# Patient Record
Sex: Female | Born: 1935 | Race: White | Hispanic: No | Smoking: Former smoker
Health system: Southern US, Community
[De-identification: ages and names within clinical notes are randomized; demographics above are authoritative.]

## PROBLEM LIST (undated history)

## (undated) DIAGNOSIS — H547 Unspecified visual loss: Secondary | ICD-10-CM

## (undated) DIAGNOSIS — I10 Essential (primary) hypertension: Secondary | ICD-10-CM

## (undated) DIAGNOSIS — E785 Hyperlipidemia, unspecified: Secondary | ICD-10-CM

## (undated) DIAGNOSIS — C4490 Unspecified malignant neoplasm of skin, unspecified: Secondary | ICD-10-CM

## (undated) HISTORY — DX: Unspecified malignant neoplasm of skin, unspecified: C44.90

## (undated) HISTORY — PX: OTHER SURGICAL HISTORY: SHX169

## (undated) HISTORY — DX: Unspecified visual loss: H54.7

## (undated) HISTORY — PX: CATARACT EXTRACTION: SUR2

## (undated) HISTORY — DX: Essential (primary) hypertension: I10

## (undated) HISTORY — DX: Hyperlipidemia, unspecified: E78.5

---

## 2005-03-26 ENCOUNTER — Ambulatory Visit (HOSPITAL_COMMUNITY): Admission: RE | Admit: 2005-03-26 | Discharge: 2005-03-26 | Payer: Self-pay | Admitting: Ophthalmology

## 2011-06-11 ENCOUNTER — Encounter (INDEPENDENT_AMBULATORY_CARE_PROVIDER_SITE_OTHER): Payer: Medicare Other | Admitting: Ophthalmology

## 2011-06-11 DIAGNOSIS — H353290 Exudative age-related macular degeneration, unspecified eye, stage unspecified: Secondary | ICD-10-CM

## 2011-06-11 DIAGNOSIS — H3530 Unspecified macular degeneration: Secondary | ICD-10-CM

## 2011-06-11 DIAGNOSIS — H43819 Vitreous degeneration, unspecified eye: Secondary | ICD-10-CM

## 2011-07-23 ENCOUNTER — Encounter (INDEPENDENT_AMBULATORY_CARE_PROVIDER_SITE_OTHER): Payer: Medicare Other | Admitting: Ophthalmology

## 2011-07-23 DIAGNOSIS — H353290 Exudative age-related macular degeneration, unspecified eye, stage unspecified: Secondary | ICD-10-CM

## 2011-07-23 DIAGNOSIS — H43819 Vitreous degeneration, unspecified eye: Secondary | ICD-10-CM

## 2011-07-23 DIAGNOSIS — H3530 Unspecified macular degeneration: Secondary | ICD-10-CM

## 2011-07-23 DIAGNOSIS — H2510 Age-related nuclear cataract, unspecified eye: Secondary | ICD-10-CM

## 2011-09-02 ENCOUNTER — Encounter (INDEPENDENT_AMBULATORY_CARE_PROVIDER_SITE_OTHER): Payer: Medicare Other | Admitting: Ophthalmology

## 2011-09-02 DIAGNOSIS — H353290 Exudative age-related macular degeneration, unspecified eye, stage unspecified: Secondary | ICD-10-CM

## 2011-09-02 DIAGNOSIS — H3530 Unspecified macular degeneration: Secondary | ICD-10-CM

## 2011-09-02 DIAGNOSIS — H43819 Vitreous degeneration, unspecified eye: Secondary | ICD-10-CM

## 2011-09-02 DIAGNOSIS — H2510 Age-related nuclear cataract, unspecified eye: Secondary | ICD-10-CM

## 2011-09-22 ENCOUNTER — Encounter: Payer: Self-pay | Admitting: Internal Medicine

## 2011-09-22 ENCOUNTER — Ambulatory Visit (INDEPENDENT_AMBULATORY_CARE_PROVIDER_SITE_OTHER): Payer: Medicare Other | Admitting: Internal Medicine

## 2011-09-22 DIAGNOSIS — F17200 Nicotine dependence, unspecified, uncomplicated: Secondary | ICD-10-CM | POA: Insufficient documentation

## 2011-09-22 DIAGNOSIS — J449 Chronic obstructive pulmonary disease, unspecified: Secondary | ICD-10-CM | POA: Insufficient documentation

## 2011-09-22 MED ORDER — PREDNISONE (PAK) 10 MG PO TABS: ORAL_TABLET | ORAL | Status: AC

## 2011-09-22 NOTE — Assessment & Plan Note (Signed)
Moderate/ severe with CB features and likely mscp from coughing. Has already failed duoneb and advair and "not interested in taking any medicaitons"  - the best we can probably do here is a short burst of prednisone then regroup with ct chest/ sinus and pft's if she wants our help.  However.  I emphasized that although we never turn away smokers from the pulmonary clinic, we do ask that they understand that the recommendations that we make  won't work nearly as well in the presence of continued cigarette exposure.  In fact, we may very well  reach a point where we can't promise to help the patient if he/she can't quit smoking. (We can and will promise to try to help, we just can't promise what we recommend will really work)

## 2011-09-22 NOTE — Patient Instructions (Addendum)
Stop smoking before it stops you - this is the most important aspect of your care- none of my recommendations will really be effective if you keep smoking.  For cough take mucinex dm over the counter  For short of breath or congestion, use neb up to every 4  hours  Prednisone 10 mg take  4 each am x 2 days,   2 each am x 2 days,  1 each am x2days and stop   You will need a cxr today at [facility] office and I will call you with the results  If not better need CT Chest or sinus, call Almyra Free [phone] to set this up

## 2011-09-22 NOTE — Progress Notes (Signed)
  Subjective:    Patient ID: Kristen Hogan, female    DOB: 08-Jun-1922, 77 y.o.   MRN: [id number]  HPI  47 yowf active smoker with tendency to cough in winter but only mild and not requiring ov's referred by Dr Kathi Der office for new onset persistent cough since July 2012  09/22/2011  Initial pulmonary office eval in EMR era cc indolent onset persistent congested day > night cough x July 2012 prod thick white mucus assoc with L  Ant/lat chest discomfort rx with neb > no better and made her shaky, no better with advair  - pattern was indolent onset persistent, not progressive, tends to resolve supine, no worse with activity and she denies limiting sob though relatively sedentary.  Sleeping ok without nocturnal  or early am exacerbation  of respiratory  c/o's or need for noct saba. Also denies any obvious fluctuation of symptoms with weather or environmental changes or other aggravating or alleviating factors except as outlined above and able to walk x 30 min s stopping.   Review of Systems  Constitutional: Negative for fever and unexpected weight change.  HENT: Negative for ear pain, nosebleeds, congestion, sore throat, rhinorrhea, sneezing, trouble swallowing, dental problem, postnasal drip and sinus pressure.   Eyes: Negative for redness and itching.  Respiratory: Positive for cough and chest tightness. Negative for shortness of breath and wheezing.   Cardiovascular: Negative for palpitations and leg swelling.  Gastrointestinal: Negative for nausea and vomiting.  Genitourinary: Negative for dysuria.  Musculoskeletal: Negative for joint swelling.  Skin: Negative for rash.  Neurological: Negative for headaches.  Hematological: Does not bruise/bleed easily.  Psychiatric/Behavioral: Negative for dysphoric mood. The patient is not nervous/anxious.        Objective:   Physical Exam 09/22/2011  111  amb wf with weathered facies and congested cough  Wt 111 09/22/2011   HEENT mild turbinate  edema.  Oropharynx no thrush or excess pnd or cobblestoning.  No JVD or cervical adenopathy. Mild accessory muscle hypertrophy. Trachea midline, nl thryroid. Chest was hyperinflated by percussion with diminished breath sounds and moderate increased exp time without wheeze. Hoover sign positive at mid inspiration. Regular rate and rhythm without murmur gallop or rub or increase P2 or edema.  Abd: no hsm, nl excursion. Ext warm without cyanosis or clubbing.     CXR  09/22/2011 :  COPD/ emphysema only, no effusion or obvious rib deformity    Assessment & Plan:

## 2011-09-22 NOTE — Assessment & Plan Note (Signed)
>   3 min discussion re:  I took an extended  opportunity with this patient to outline the consequences of continued cigarette use  in airway disorders based on all the data we have from the multiple national lung health studies (perfomed over decades at millions of dollars in cost)  indicating that smoking cessation, not choice of inhalers or physicians, is the most important aspect of care.

## 2011-10-14 ENCOUNTER — Encounter (INDEPENDENT_AMBULATORY_CARE_PROVIDER_SITE_OTHER): Payer: Medicare Other | Admitting: Ophthalmology

## 2011-10-14 DIAGNOSIS — H3530 Unspecified macular degeneration: Secondary | ICD-10-CM

## 2011-10-14 DIAGNOSIS — H353290 Exudative age-related macular degeneration, unspecified eye, stage unspecified: Secondary | ICD-10-CM

## 2011-10-14 DIAGNOSIS — H43819 Vitreous degeneration, unspecified eye: Secondary | ICD-10-CM

## 2011-10-14 DIAGNOSIS — H2510 Age-related nuclear cataract, unspecified eye: Secondary | ICD-10-CM

## 2011-11-25 ENCOUNTER — Encounter (INDEPENDENT_AMBULATORY_CARE_PROVIDER_SITE_OTHER): Payer: Medicare Other | Admitting: Ophthalmology

## 2012-02-04 ENCOUNTER — Encounter (INDEPENDENT_AMBULATORY_CARE_PROVIDER_SITE_OTHER): Payer: Medicare Other | Admitting: Ophthalmology

## 2012-02-04 DIAGNOSIS — H2510 Age-related nuclear cataract, unspecified eye: Secondary | ICD-10-CM

## 2012-02-04 DIAGNOSIS — H43819 Vitreous degeneration, unspecified eye: Secondary | ICD-10-CM

## 2012-02-04 DIAGNOSIS — H353290 Exudative age-related macular degeneration, unspecified eye, stage unspecified: Secondary | ICD-10-CM

## 2012-02-04 DIAGNOSIS — H3530 Unspecified macular degeneration: Secondary | ICD-10-CM

## 2012-03-03 ENCOUNTER — Encounter (INDEPENDENT_AMBULATORY_CARE_PROVIDER_SITE_OTHER): Payer: Medicare Other | Admitting: Ophthalmology

## 2012-03-03 DIAGNOSIS — H43819 Vitreous degeneration, unspecified eye: Secondary | ICD-10-CM

## 2012-03-03 DIAGNOSIS — H353290 Exudative age-related macular degeneration, unspecified eye, stage unspecified: Secondary | ICD-10-CM

## 2012-03-03 DIAGNOSIS — H3530 Unspecified macular degeneration: Secondary | ICD-10-CM

## 2012-03-03 DIAGNOSIS — H2510 Age-related nuclear cataract, unspecified eye: Secondary | ICD-10-CM

## 2012-03-30 ENCOUNTER — Encounter (INDEPENDENT_AMBULATORY_CARE_PROVIDER_SITE_OTHER): Payer: Medicare Other | Admitting: Ophthalmology

## 2012-03-30 DIAGNOSIS — H353290 Exudative age-related macular degeneration, unspecified eye, stage unspecified: Secondary | ICD-10-CM

## 2012-03-30 DIAGNOSIS — H2510 Age-related nuclear cataract, unspecified eye: Secondary | ICD-10-CM

## 2012-03-30 DIAGNOSIS — H43819 Vitreous degeneration, unspecified eye: Secondary | ICD-10-CM

## 2012-03-30 DIAGNOSIS — H3530 Unspecified macular degeneration: Secondary | ICD-10-CM

## 2012-04-27 ENCOUNTER — Encounter (INDEPENDENT_AMBULATORY_CARE_PROVIDER_SITE_OTHER): Payer: Medicare Other | Admitting: Ophthalmology

## 2012-04-27 DIAGNOSIS — H3530 Unspecified macular degeneration: Secondary | ICD-10-CM

## 2012-04-27 DIAGNOSIS — H2510 Age-related nuclear cataract, unspecified eye: Secondary | ICD-10-CM

## 2012-04-27 DIAGNOSIS — H43819 Vitreous degeneration, unspecified eye: Secondary | ICD-10-CM

## 2012-04-27 DIAGNOSIS — H353290 Exudative age-related macular degeneration, unspecified eye, stage unspecified: Secondary | ICD-10-CM

## 2012-05-26 ENCOUNTER — Encounter (INDEPENDENT_AMBULATORY_CARE_PROVIDER_SITE_OTHER): Payer: Medicare Other | Admitting: Ophthalmology

## 2012-05-26 DIAGNOSIS — H353290 Exudative age-related macular degeneration, unspecified eye, stage unspecified: Secondary | ICD-10-CM

## 2012-05-26 DIAGNOSIS — H35039 Hypertensive retinopathy, unspecified eye: Secondary | ICD-10-CM

## 2012-05-26 DIAGNOSIS — H3530 Unspecified macular degeneration: Secondary | ICD-10-CM

## 2012-05-26 DIAGNOSIS — I10 Essential (primary) hypertension: Secondary | ICD-10-CM

## 2012-05-26 DIAGNOSIS — H2510 Age-related nuclear cataract, unspecified eye: Secondary | ICD-10-CM

## 2012-05-26 DIAGNOSIS — H43399 Other vitreous opacities, unspecified eye: Secondary | ICD-10-CM

## 2012-06-22 ENCOUNTER — Encounter (INDEPENDENT_AMBULATORY_CARE_PROVIDER_SITE_OTHER): Payer: Medicare Other | Admitting: Ophthalmology

## 2012-06-22 DIAGNOSIS — I10 Essential (primary) hypertension: Secondary | ICD-10-CM

## 2012-06-22 DIAGNOSIS — H353290 Exudative age-related macular degeneration, unspecified eye, stage unspecified: Secondary | ICD-10-CM

## 2012-06-22 DIAGNOSIS — H43819 Vitreous degeneration, unspecified eye: Secondary | ICD-10-CM

## 2012-06-22 DIAGNOSIS — H2510 Age-related nuclear cataract, unspecified eye: Secondary | ICD-10-CM

## 2012-06-22 DIAGNOSIS — H35039 Hypertensive retinopathy, unspecified eye: Secondary | ICD-10-CM

## 2012-06-22 DIAGNOSIS — H3530 Unspecified macular degeneration: Secondary | ICD-10-CM

## 2012-07-21 ENCOUNTER — Encounter (INDEPENDENT_AMBULATORY_CARE_PROVIDER_SITE_OTHER): Payer: Medicare Other | Admitting: Ophthalmology

## 2012-07-21 DIAGNOSIS — H3530 Unspecified macular degeneration: Secondary | ICD-10-CM

## 2012-07-21 DIAGNOSIS — H353290 Exudative age-related macular degeneration, unspecified eye, stage unspecified: Secondary | ICD-10-CM

## 2012-07-21 DIAGNOSIS — H43819 Vitreous degeneration, unspecified eye: Secondary | ICD-10-CM

## 2012-07-21 DIAGNOSIS — H2510 Age-related nuclear cataract, unspecified eye: Secondary | ICD-10-CM

## 2012-07-21 DIAGNOSIS — H35039 Hypertensive retinopathy, unspecified eye: Secondary | ICD-10-CM

## 2012-07-21 DIAGNOSIS — I10 Essential (primary) hypertension: Secondary | ICD-10-CM

## 2012-08-17 ENCOUNTER — Encounter (INDEPENDENT_AMBULATORY_CARE_PROVIDER_SITE_OTHER): Payer: Medicare Other | Admitting: Ophthalmology

## 2012-08-17 DIAGNOSIS — H2510 Age-related nuclear cataract, unspecified eye: Secondary | ICD-10-CM

## 2012-08-17 DIAGNOSIS — H35039 Hypertensive retinopathy, unspecified eye: Secondary | ICD-10-CM

## 2012-08-17 DIAGNOSIS — H353290 Exudative age-related macular degeneration, unspecified eye, stage unspecified: Secondary | ICD-10-CM

## 2012-08-17 DIAGNOSIS — I10 Essential (primary) hypertension: Secondary | ICD-10-CM

## 2012-08-17 DIAGNOSIS — H3530 Unspecified macular degeneration: Secondary | ICD-10-CM

## 2012-08-17 DIAGNOSIS — H43819 Vitreous degeneration, unspecified eye: Secondary | ICD-10-CM

## 2012-09-15 ENCOUNTER — Encounter (INDEPENDENT_AMBULATORY_CARE_PROVIDER_SITE_OTHER): Payer: Medicare Other | Admitting: Ophthalmology

## 2012-09-15 DIAGNOSIS — H2510 Age-related nuclear cataract, unspecified eye: Secondary | ICD-10-CM

## 2012-09-15 DIAGNOSIS — H35039 Hypertensive retinopathy, unspecified eye: Secondary | ICD-10-CM

## 2012-09-15 DIAGNOSIS — H353290 Exudative age-related macular degeneration, unspecified eye, stage unspecified: Secondary | ICD-10-CM

## 2012-09-15 DIAGNOSIS — H43819 Vitreous degeneration, unspecified eye: Secondary | ICD-10-CM

## 2012-09-15 DIAGNOSIS — H3530 Unspecified macular degeneration: Secondary | ICD-10-CM

## 2012-09-15 DIAGNOSIS — I10 Essential (primary) hypertension: Secondary | ICD-10-CM

## 2012-10-17 ENCOUNTER — Encounter (INDEPENDENT_AMBULATORY_CARE_PROVIDER_SITE_OTHER): Payer: Medicare Other | Admitting: Ophthalmology

## 2012-10-17 DIAGNOSIS — H35039 Hypertensive retinopathy, unspecified eye: Secondary | ICD-10-CM

## 2012-10-17 DIAGNOSIS — H3530 Unspecified macular degeneration: Secondary | ICD-10-CM

## 2012-10-17 DIAGNOSIS — I10 Essential (primary) hypertension: Secondary | ICD-10-CM

## 2012-10-17 DIAGNOSIS — H353290 Exudative age-related macular degeneration, unspecified eye, stage unspecified: Secondary | ICD-10-CM

## 2012-10-17 DIAGNOSIS — H43819 Vitreous degeneration, unspecified eye: Secondary | ICD-10-CM

## 2012-11-23 ENCOUNTER — Encounter (INDEPENDENT_AMBULATORY_CARE_PROVIDER_SITE_OTHER): Payer: Medicare Other | Admitting: Ophthalmology

## 2012-11-23 DIAGNOSIS — H353290 Exudative age-related macular degeneration, unspecified eye, stage unspecified: Secondary | ICD-10-CM

## 2012-11-23 DIAGNOSIS — H43819 Vitreous degeneration, unspecified eye: Secondary | ICD-10-CM

## 2012-11-23 DIAGNOSIS — I10 Essential (primary) hypertension: Secondary | ICD-10-CM

## 2012-11-23 DIAGNOSIS — H3530 Unspecified macular degeneration: Secondary | ICD-10-CM

## 2012-11-23 DIAGNOSIS — H35039 Hypertensive retinopathy, unspecified eye: Secondary | ICD-10-CM

## 2012-12-21 ENCOUNTER — Encounter (INDEPENDENT_AMBULATORY_CARE_PROVIDER_SITE_OTHER): Payer: Medicare Other | Admitting: Ophthalmology

## 2012-12-21 DIAGNOSIS — H35039 Hypertensive retinopathy, unspecified eye: Secondary | ICD-10-CM

## 2012-12-21 DIAGNOSIS — I10 Essential (primary) hypertension: Secondary | ICD-10-CM

## 2012-12-21 DIAGNOSIS — H3530 Unspecified macular degeneration: Secondary | ICD-10-CM

## 2012-12-21 DIAGNOSIS — H353290 Exudative age-related macular degeneration, unspecified eye, stage unspecified: Secondary | ICD-10-CM

## 2013-01-18 ENCOUNTER — Encounter (INDEPENDENT_AMBULATORY_CARE_PROVIDER_SITE_OTHER): Payer: Medicare Other | Admitting: Ophthalmology

## 2013-01-18 DIAGNOSIS — H353290 Exudative age-related macular degeneration, unspecified eye, stage unspecified: Secondary | ICD-10-CM

## 2013-01-18 DIAGNOSIS — H3530 Unspecified macular degeneration: Secondary | ICD-10-CM

## 2013-01-18 DIAGNOSIS — H2510 Age-related nuclear cataract, unspecified eye: Secondary | ICD-10-CM

## 2013-01-18 DIAGNOSIS — H35039 Hypertensive retinopathy, unspecified eye: Secondary | ICD-10-CM

## 2013-01-18 DIAGNOSIS — I10 Essential (primary) hypertension: Secondary | ICD-10-CM

## 2013-01-18 DIAGNOSIS — H43819 Vitreous degeneration, unspecified eye: Secondary | ICD-10-CM

## 2013-02-16 ENCOUNTER — Encounter (INDEPENDENT_AMBULATORY_CARE_PROVIDER_SITE_OTHER): Payer: Medicare Other | Admitting: Ophthalmology

## 2013-02-16 DIAGNOSIS — H3530 Unspecified macular degeneration: Secondary | ICD-10-CM

## 2013-02-16 DIAGNOSIS — H35039 Hypertensive retinopathy, unspecified eye: Secondary | ICD-10-CM

## 2013-02-16 DIAGNOSIS — H353290 Exudative age-related macular degeneration, unspecified eye, stage unspecified: Secondary | ICD-10-CM

## 2013-02-16 DIAGNOSIS — H2510 Age-related nuclear cataract, unspecified eye: Secondary | ICD-10-CM

## 2013-02-16 DIAGNOSIS — H43819 Vitreous degeneration, unspecified eye: Secondary | ICD-10-CM

## 2013-02-16 DIAGNOSIS — I10 Essential (primary) hypertension: Secondary | ICD-10-CM

## 2013-02-18 ENCOUNTER — Other Ambulatory Visit: Payer: Self-pay | Admitting: Family Medicine

## 2013-03-16 ENCOUNTER — Encounter (INDEPENDENT_AMBULATORY_CARE_PROVIDER_SITE_OTHER): Payer: Medicare Other | Admitting: Ophthalmology

## 2013-03-16 DIAGNOSIS — H3530 Unspecified macular degeneration: Secondary | ICD-10-CM

## 2013-03-16 DIAGNOSIS — I10 Essential (primary) hypertension: Secondary | ICD-10-CM

## 2013-03-16 DIAGNOSIS — H2510 Age-related nuclear cataract, unspecified eye: Secondary | ICD-10-CM

## 2013-03-16 DIAGNOSIS — H43819 Vitreous degeneration, unspecified eye: Secondary | ICD-10-CM

## 2013-03-16 DIAGNOSIS — H353290 Exudative age-related macular degeneration, unspecified eye, stage unspecified: Secondary | ICD-10-CM

## 2013-03-16 DIAGNOSIS — H35039 Hypertensive retinopathy, unspecified eye: Secondary | ICD-10-CM

## 2013-03-21 ENCOUNTER — Other Ambulatory Visit: Payer: Self-pay | Admitting: Physician Assistant

## 2013-04-12 ENCOUNTER — Encounter (INDEPENDENT_AMBULATORY_CARE_PROVIDER_SITE_OTHER): Payer: Medicare Other | Admitting: Ophthalmology

## 2013-04-12 DIAGNOSIS — H43819 Vitreous degeneration, unspecified eye: Secondary | ICD-10-CM

## 2013-04-12 DIAGNOSIS — H35039 Hypertensive retinopathy, unspecified eye: Secondary | ICD-10-CM

## 2013-04-12 DIAGNOSIS — H3530 Unspecified macular degeneration: Secondary | ICD-10-CM

## 2013-04-12 DIAGNOSIS — I10 Essential (primary) hypertension: Secondary | ICD-10-CM

## 2013-04-12 DIAGNOSIS — H2510 Age-related nuclear cataract, unspecified eye: Secondary | ICD-10-CM

## 2013-04-12 DIAGNOSIS — H353290 Exudative age-related macular degeneration, unspecified eye, stage unspecified: Secondary | ICD-10-CM

## 2013-04-25 ENCOUNTER — Ambulatory Visit (INDEPENDENT_AMBULATORY_CARE_PROVIDER_SITE_OTHER): Payer: Medicare Other | Admitting: Family Medicine

## 2013-04-25 ENCOUNTER — Encounter: Payer: Self-pay | Admitting: Family Medicine

## 2013-04-25 VITALS — BP 136/64 | HR 74 | Temp 98.0°F | Ht 63.0 in | Wt 115.2 lb

## 2013-04-25 DIAGNOSIS — I10 Essential (primary) hypertension: Secondary | ICD-10-CM

## 2013-04-25 DIAGNOSIS — E785 Hyperlipidemia, unspecified: Secondary | ICD-10-CM

## 2013-04-25 DIAGNOSIS — J441 Chronic obstructive pulmonary disease with (acute) exacerbation: Secondary | ICD-10-CM

## 2013-04-25 DIAGNOSIS — L989 Disorder of the skin and subcutaneous tissue, unspecified: Secondary | ICD-10-CM

## 2013-04-25 LAB — POCT CBC
Granulocyte percent: 79.8 %G (ref 37–80)
HCT, POC: 40.5 % (ref 37.7–47.9)
Hemoglobin: 13.7 g/dL (ref 12.2–16.2)
Lymph, poc: 1.7 (ref 0.6–3.4)
MCH, POC: 30.1 pg (ref 27–31.2)
MCHC: 33.9 g/dL (ref 31.8–35.4)
MCV: 88.8 fL (ref 80–97)
MPV: 8.1 fL (ref 0–99.8)
POC Granulocyte: 7.9 — AB (ref 2–6.9)
POC LYMPH PERCENT: 16.7 %L (ref 10–50)
Platelet Count, POC: 254 10*3/uL (ref 142–424)
RBC: 4.6 M/uL (ref 4.04–5.48)
RDW, POC: 14.4 %
WBC: 9.9 10*3/uL (ref 4.6–10.2)

## 2013-04-25 MED ORDER — POTASSIUM CHLORIDE CRYS ER 20 MEQ PO TBCR: 20.0000 meq | EXTENDED_RELEASE_TABLET | Freq: Every day | ORAL | Status: DC

## 2013-04-25 MED ORDER — AMLODIPINE BESYLATE 5 MG PO TABS: 5.0000 mg | ORAL_TABLET | Freq: Every day | ORAL | Status: DC

## 2013-04-25 MED ORDER — IPRATROPIUM-ALBUTEROL 0.5-2.5 (3) MG/3ML IN SOLN: 3.0000 mL | Freq: Two times a day (BID) | RESPIRATORY_TRACT | Status: DC

## 2013-04-25 MED ORDER — HYDROCHLOROTHIAZIDE 25 MG PO TABS: 25.0000 mg | ORAL_TABLET | Freq: Every day | ORAL | Status: DC

## 2013-04-25 MED ORDER — DILTIAZEM HCL ER COATED BEADS 240 MG PO CP24: 240.0000 mg | ORAL_CAPSULE | Freq: Every day | ORAL | Status: DC

## 2013-04-25 NOTE — Patient Instructions (Signed)

## 2013-04-25 NOTE — Progress Notes (Signed)
  Subjective:    Patient ID: Kristen Hogan, female    DOB: Apr 29, 1922, 79 y.o.   MRN: [id number]  HPI This 79 y.o. female presents for follow up visit for COPD, Hypertension, and hyperlipidemia. She is seeing opthamology in [place] for right eye blindness and shots in her right eye. She has hx of hypoxemia and is on oxygen although she is not wearing it today.  She has some pains in her left intercostals.  She has skin lesions on her nose, chin, left forehead, and right jaw area.    Review of Systems  Constitutional: Negative.   HENT: Negative.   Respiratory: Positive for shortness of breath. Negative for apnea, cough, choking and chest tightness.   Cardiovascular: Negative.   Gastrointestinal: Negative.   Skin: Positive for wound. Negative for color change, pallor and rash.       Objective:   Physical Exam  Constitutional: She appears cachectic. She appears ill.  HENT:  Head: Normocephalic and atraumatic.  Eyes: Conjunctivae are normal. Pupils are equal, round, and reactive to light.  Cardiovascular: Normal rate and regular rhythm.   Pulmonary/Chest: Effort normal and breath sounds normal.  Abdominal: Soft. Normal appearance. Bowel sounds are absent. There is no tenderness.  Skin: Lesion noted.  Scab over tip of nose, cystic lesion over left chin, right jaw area, and left temporal area.           Assessment & Plan:  Skin lesion of face - Plan: Ambulatory referral to Dermatology  Unspecified essential hypertension - Plan: POCT CBC, COMPLETE METABOLIC PANEL WITH GFR, amLODipine (NORVASC) 5 MG tablet, diltiazem (CARDIZEM CD) 240 MG 24 hr capsule, hydrochlorothiazide (HYDRODIURIL) 25 MG tablet, potassium chloride SA (KLOR-CON M20) 20 MEQ tablet  COPD exacerbation - Plan: ipratropium-albuterol (DUONEB) 0.5-2.5 (3) MG/3ML SOLN Check on oxygen orders and reorder oxygen.   Other and unspecified hyperlipidemia - Lipids were normal last blood draw and recommend to  continue Current regimen.  Labs - CMP, CBC, follow up in 3 months.

## 2013-04-26 LAB — COMPLETE METABOLIC PANEL WITH GFR
ALT: 9 U/L (ref 0–35)
AST: 15 U/L (ref 0–37)
Albumin: 4.1 g/dL (ref 3.5–5.2)
Alkaline Phosphatase: 99 U/L (ref 39–117)
BUN: 16 mg/dL (ref 6–23)
CO2: 24 mEq/L (ref 19–32)
Calcium: 9.8 mg/dL (ref 8.4–10.5)
Chloride: 105 mEq/L (ref 96–112)
Creat: 1.17 mg/dL — ABNORMAL HIGH (ref 0.50–1.10)
GFR, Est African American: 48 mL/min — ABNORMAL LOW
GFR, Est Non African American: 42 mL/min — ABNORMAL LOW
Glucose, Bld: 105 mg/dL — ABNORMAL HIGH (ref 70–99)
Potassium: 4 mEq/L (ref 3.5–5.3)
Sodium: 137 mEq/L (ref 135–145)
Total Bilirubin: 0.4 mg/dL (ref 0.3–1.2)
Total Protein: 6.9 g/dL (ref 6.0–8.3)

## 2013-05-08 ENCOUNTER — Other Ambulatory Visit: Payer: Self-pay | Admitting: *Deleted

## 2013-05-08 MED ORDER — ATORVASTATIN CALCIUM 20 MG PO TABS: 20.0000 mg | ORAL_TABLET | Freq: Every day | ORAL | Status: DC

## 2013-05-10 ENCOUNTER — Encounter (INDEPENDENT_AMBULATORY_CARE_PROVIDER_SITE_OTHER): Payer: Medicare Other | Admitting: Ophthalmology

## 2013-05-10 DIAGNOSIS — H353290 Exudative age-related macular degeneration, unspecified eye, stage unspecified: Secondary | ICD-10-CM

## 2013-05-10 DIAGNOSIS — H3530 Unspecified macular degeneration: Secondary | ICD-10-CM

## 2013-05-10 DIAGNOSIS — I10 Essential (primary) hypertension: Secondary | ICD-10-CM

## 2013-05-10 DIAGNOSIS — H2510 Age-related nuclear cataract, unspecified eye: Secondary | ICD-10-CM

## 2013-05-10 DIAGNOSIS — H35039 Hypertensive retinopathy, unspecified eye: Secondary | ICD-10-CM

## 2013-05-10 DIAGNOSIS — H43819 Vitreous degeneration, unspecified eye: Secondary | ICD-10-CM

## 2013-06-07 ENCOUNTER — Encounter (INDEPENDENT_AMBULATORY_CARE_PROVIDER_SITE_OTHER): Payer: Medicare Other | Admitting: Ophthalmology

## 2013-06-07 DIAGNOSIS — H3530 Unspecified macular degeneration: Secondary | ICD-10-CM

## 2013-06-07 DIAGNOSIS — H353290 Exudative age-related macular degeneration, unspecified eye, stage unspecified: Secondary | ICD-10-CM

## 2013-06-07 DIAGNOSIS — H43819 Vitreous degeneration, unspecified eye: Secondary | ICD-10-CM

## 2013-06-07 DIAGNOSIS — H35039 Hypertensive retinopathy, unspecified eye: Secondary | ICD-10-CM

## 2013-06-07 DIAGNOSIS — I10 Essential (primary) hypertension: Secondary | ICD-10-CM

## 2013-07-03 ENCOUNTER — Other Ambulatory Visit: Payer: Self-pay | Admitting: Family Medicine

## 2013-07-05 ENCOUNTER — Encounter (INDEPENDENT_AMBULATORY_CARE_PROVIDER_SITE_OTHER): Payer: Medicare Other | Admitting: Ophthalmology

## 2013-07-05 DIAGNOSIS — H43819 Vitreous degeneration, unspecified eye: Secondary | ICD-10-CM

## 2013-07-05 DIAGNOSIS — H35039 Hypertensive retinopathy, unspecified eye: Secondary | ICD-10-CM

## 2013-07-05 DIAGNOSIS — H3530 Unspecified macular degeneration: Secondary | ICD-10-CM

## 2013-07-05 DIAGNOSIS — H353290 Exudative age-related macular degeneration, unspecified eye, stage unspecified: Secondary | ICD-10-CM

## 2013-07-05 DIAGNOSIS — I10 Essential (primary) hypertension: Secondary | ICD-10-CM

## 2013-07-05 NOTE — Telephone Encounter (Signed)
Last lipid 2/14  ACM

## 2013-07-13 ENCOUNTER — Ambulatory Visit (INDEPENDENT_AMBULATORY_CARE_PROVIDER_SITE_OTHER): Payer: Medicare Other | Admitting: Family Medicine

## 2013-07-13 ENCOUNTER — Encounter: Payer: Self-pay | Admitting: Family Medicine

## 2013-07-13 ENCOUNTER — Ambulatory Visit (INDEPENDENT_AMBULATORY_CARE_PROVIDER_SITE_OTHER): Payer: Medicare Other

## 2013-07-13 VITALS — BP 135/57 | HR 74 | Temp 97.8°F | Ht 63.0 in | Wt 115.8 lb

## 2013-07-13 DIAGNOSIS — J441 Chronic obstructive pulmonary disease with (acute) exacerbation: Secondary | ICD-10-CM

## 2013-07-13 DIAGNOSIS — I10 Essential (primary) hypertension: Secondary | ICD-10-CM

## 2013-07-13 DIAGNOSIS — J209 Acute bronchitis, unspecified: Secondary | ICD-10-CM

## 2013-07-13 LAB — POCT CBC
Granulocyte percent: 78.1 %G (ref 37–80)
HCT, POC: 40 % (ref 37.7–47.9)
Hemoglobin: 13.2 g/dL (ref 12.2–16.2)
Lymph, poc: 1.4 (ref 0.6–3.4)
MCH, POC: 29.5 pg (ref 27–31.2)
MCHC: 33.1 g/dL (ref 31.8–35.4)
MCV: 89.2 fL (ref 80–97)
MPV: 7.5 fL (ref 0–99.8)
POC Granulocyte: 7.3 — AB (ref 2–6.9)
POC LYMPH PERCENT: 15.5 %L (ref 10–50)
Platelet Count, POC: 259 10*3/uL (ref 142–424)
RBC: 4.5 M/uL (ref 4.04–5.48)
RDW, POC: 14.6 %
WBC: 9.3 10*3/uL (ref 4.6–10.2)

## 2013-07-13 MED ORDER — ATORVASTATIN CALCIUM 20 MG PO TABS: 20.0000 mg | ORAL_TABLET | Freq: Every day | ORAL | Status: DC

## 2013-07-13 MED ORDER — HYDROCHLOROTHIAZIDE 25 MG PO TABS: 25.0000 mg | ORAL_TABLET | Freq: Every day | ORAL | Status: DC

## 2013-07-13 MED ORDER — METHYLPREDNISOLONE (PAK) 4 MG PO TABS: ORAL_TABLET | ORAL | Status: DC

## 2013-07-13 MED ORDER — AMLODIPINE BESYLATE 5 MG PO TABS: 5.0000 mg | ORAL_TABLET | Freq: Every day | ORAL | Status: DC

## 2013-07-13 MED ORDER — LEVOFLOXACIN 500 MG PO TABS: 500.0000 mg | ORAL_TABLET | Freq: Every day | ORAL | Status: DC

## 2013-07-13 MED ORDER — DILTIAZEM HCL ER COATED BEADS 240 MG PO CP24: 240.0000 mg | ORAL_CAPSULE | Freq: Every day | ORAL | Status: DC

## 2013-07-13 MED ORDER — IPRATROPIUM-ALBUTEROL 0.5-2.5 (3) MG/3ML IN SOLN: 3.0000 mL | Freq: Two times a day (BID) | RESPIRATORY_TRACT | Status: DC

## 2013-07-13 NOTE — Patient Instructions (Signed)

## 2013-07-13 NOTE — Progress Notes (Signed)
  Subjective:    Patient ID: Kristen Hogan, female    DOB: 31-Mar-1922, 79 y.o.   MRN: [id number]  HPI This 79 y.o. female presents for evaluation of cough and congestion for about a month. She has been coughing up mucopurulent sputum.  She is eating ok.  She is having fever. She gets SOB.   Review of Systems C/o cough, SOB, and productive sputum No chest pain,  HA, dizziness, vision change, N/V, diarrhea, constipation, dysuria, urinary urgency or frequency, myalgias, arthralgias or rash.     Objective:   Physical Exam Vital signs noted  Chronically ill appearing female.  HEENT - Head atraumatic Normocephalic                Eyes - PERRLA, Conjuctiva - clear Sclera- Clear EOMI                Ears - EAC's Wnl TM's Wnl Gross Hearing WNL                Throat - oropharanx wnl Respiratory - Lungs with rhonchi bilateral Cardiac - RRR S1 and S2 without murmur GI - Abdomen soft Nontender and bowel sounds active x 4 Extremities - No edema. Neuro - Grossly intact.   CXR shows Copd with possible left lower lobe infiltrate    Assessment & Plan:  Acute bronchitis - Plan: levofloxacin (LEVAQUIN) 500 MG tablet, methylPREDNIsolone (MEDROL DOSPACK) 4 MG tablet, DG Chest 2 View.  Continue neb tx's at home and instructed to push po fluids and follow up in one week.  Unspecified essential hypertension - Plan: amLODipine (NORVASC) 5 MG tablet, diltiazem (CARDIZEM CD) 240 MG 24 hr capsule, hydrochlorothiazide (HYDRODIURIL) 25 MG tablet, POCT CBC, CMP14+EGFR, TSH.  Continue current regimen and controlled.  COPD exacerbation - Plan: ipratropium-albuterol (DUONEB) 0.5-2.5 (3) MG/3ML SOLN, Levaquin 500mg  po qd x 7days And continue neb tx's.  Follow up in one week.

## 2013-07-14 LAB — TSH: TSH: 2.84 u[IU]/mL (ref 0.450–4.500)

## 2013-07-14 LAB — CMP14+EGFR
ALT: 13 IU/L (ref 0–32)
AST: 20 IU/L (ref 0–40)
Albumin/Globulin Ratio: 1.7 (ref 1.1–2.5)
Albumin: 4.2 g/dL (ref 3.5–4.7)
Alkaline Phosphatase: 108 IU/L (ref 39–117)
BUN/Creatinine Ratio: 14 (ref 11–26)
BUN: 18 mg/dL (ref 8–27)
CO2: 22 mmol/L (ref 18–29)
Calcium: 9.9 mg/dL (ref 8.6–10.2)
Chloride: 100 mmol/L (ref 97–108)
Creatinine, Ser: 1.25 mg/dL — ABNORMAL HIGH (ref 0.57–1.00)
GFR calc Af Amer: 44 mL/min/{1.73_m2} — ABNORMAL LOW (ref >59)
GFR calc non Af Amer: 38 mL/min/{1.73_m2} — ABNORMAL LOW (ref >59)
Globulin, Total: 2.5 g/dL (ref 1.5–4.5)
Glucose: 105 mg/dL — ABNORMAL HIGH (ref 65–99)
Potassium: 4.2 mmol/L (ref 3.5–5.2)
Sodium: 140 mmol/L (ref 134–144)
Total Bilirubin: 0.4 mg/dL (ref 0.0–1.2)
Total Protein: 6.7 g/dL (ref 6.0–8.5)

## 2013-07-20 ENCOUNTER — Encounter: Payer: Self-pay | Admitting: Family Medicine

## 2013-07-20 ENCOUNTER — Ambulatory Visit (INDEPENDENT_AMBULATORY_CARE_PROVIDER_SITE_OTHER): Payer: Medicare Other | Admitting: Family Medicine

## 2013-07-20 ENCOUNTER — Ambulatory Visit (INDEPENDENT_AMBULATORY_CARE_PROVIDER_SITE_OTHER): Payer: Medicare Other

## 2013-07-20 VITALS — BP 145/62 | HR 91 | Temp 98.8°F | Ht 63.0 in | Wt 113.0 lb

## 2013-07-20 DIAGNOSIS — J209 Acute bronchitis, unspecified: Secondary | ICD-10-CM

## 2013-07-20 DIAGNOSIS — J189 Pneumonia, unspecified organism: Secondary | ICD-10-CM

## 2013-07-20 MED ORDER — LEVOFLOXACIN 500 MG PO TABS: 500.0000 mg | ORAL_TABLET | Freq: Every day | ORAL | Status: DC

## 2013-07-20 NOTE — Progress Notes (Signed)
  Subjective:    Patient ID: Kristen Hogan, female    DOB: 09-Jul-1922, 79 y.o.   MRN: [id number]  HPI This 79 y.o. female presents for evaluation of follow up on her pneumonia. She still has a cough that is productive but is feeling some better.  She has  Hx of Copd and she wears oxygen at home. She has been taking levaquin and has One more tablet.  She is accompanied by her daughter who states she is still sick.   Review of Systems C/o cough and fatigue No chest pain, SOB, HA, dizziness, vision change, N/V, diarrhea, constipation, dysuria, urinary urgency or frequency, myalgias, arthralgias or rash.     Objective:   Physical Exam  Vital signs noted  Thin chronically ill appearing female.  HEENT - Head atraumatic Normocephalic                Eyes - PERRLA, Conjuctiva - clear Sclera- Clear EOMI                Ears - EAC's Wnl TM's Wnl Gross Hearing WNL                Nose - Nares patent                 Throat - oropharanx wnl Respiratory - Lungs CTA bilateral Oxygen saturation 90-92% on room air Cardiac - RRR S1 and S2 without murmur   CXR preliminary read - Left lower lobe infiltrate present.    Assessment & Plan:  Acute bronchitis - Plan: levofloxacin (LEVAQUIN) 500 MG tablet  Pneumonia - Plan: levofloxacin (LEVAQUIN) 500 MG tablet, DG Chest 2 View Discussed with patient that admission to hospital may be necessary and she refuses. Advised her to wear her oxygen at home and to continue nebs, continue mucinex, Follow up in one week and prn if sx's worsen.

## 2013-07-20 NOTE — Patient Instructions (Signed)

## 2013-07-27 ENCOUNTER — Ambulatory Visit (INDEPENDENT_AMBULATORY_CARE_PROVIDER_SITE_OTHER): Payer: Medicare Other | Admitting: Family Medicine

## 2013-07-27 ENCOUNTER — Encounter: Payer: Self-pay | Admitting: Family Medicine

## 2013-07-27 ENCOUNTER — Ambulatory Visit (INDEPENDENT_AMBULATORY_CARE_PROVIDER_SITE_OTHER): Payer: Medicare Other

## 2013-07-27 VITALS — BP 122/61 | HR 79 | Temp 99.7°F | Ht 63.0 in | Wt 113.8 lb

## 2013-07-27 DIAGNOSIS — J209 Acute bronchitis, unspecified: Secondary | ICD-10-CM

## 2013-07-27 NOTE — Progress Notes (Signed)
  Subjective:    Patient ID: Kristen Hogan, female    DOB: Nov 27, 1921, 79 y.o.   MRN: [id number]  HPI This 79 y.o. female presents for evaluation of follow up on bronchitis/pneumonia. She has hx of Copd.   Review of Systems No chest pain, SOB, HA, dizziness, vision change, N/V, diarrhea, constipation, dysuria, urinary urgency or frequency, myalgias, arthralgias or rash.     Objective:   Physical Exam  Vital signs noted  Well developed well nourished female.  HEENT - Head atraumatic Normocephalic Respiratory - Lungs rhonchi bilateral Cardiac - RRR S1 and S2 without murmur GI - Abdomen soft Nontender and bowel sounds active x 4 Neuro - Grossly intact.  CXR preliminary read - COPD no infiltrates    Assessment & Plan:  Acute bronchitis - Plan: DG Chest 2 View Continue mucinex and neb tx's and Follow up in 3 months.

## 2013-07-30 ENCOUNTER — Other Ambulatory Visit: Payer: Self-pay | Admitting: Nurse Practitioner

## 2013-08-03 ENCOUNTER — Encounter (INDEPENDENT_AMBULATORY_CARE_PROVIDER_SITE_OTHER): Payer: Medicare Other | Admitting: Ophthalmology

## 2013-08-03 DIAGNOSIS — H43819 Vitreous degeneration, unspecified eye: Secondary | ICD-10-CM

## 2013-08-03 DIAGNOSIS — H35039 Hypertensive retinopathy, unspecified eye: Secondary | ICD-10-CM

## 2013-08-03 DIAGNOSIS — H3530 Unspecified macular degeneration: Secondary | ICD-10-CM

## 2013-08-03 DIAGNOSIS — I10 Essential (primary) hypertension: Secondary | ICD-10-CM

## 2013-08-03 DIAGNOSIS — H353290 Exudative age-related macular degeneration, unspecified eye, stage unspecified: Secondary | ICD-10-CM

## 2013-08-24 ENCOUNTER — Ambulatory Visit (INDEPENDENT_AMBULATORY_CARE_PROVIDER_SITE_OTHER): Payer: Medicare Other

## 2013-08-24 DIAGNOSIS — Z23 Encounter for immunization: Secondary | ICD-10-CM

## 2013-08-24 NOTE — Addendum Note (Signed)
Addended by: Fawn Kirk on: 08/24/2013 09:40 AM   Modules accepted: Orders

## 2013-08-31 ENCOUNTER — Encounter (INDEPENDENT_AMBULATORY_CARE_PROVIDER_SITE_OTHER): Payer: Medicare Other | Admitting: Ophthalmology

## 2013-08-31 DIAGNOSIS — I10 Essential (primary) hypertension: Secondary | ICD-10-CM

## 2013-08-31 DIAGNOSIS — H353290 Exudative age-related macular degeneration, unspecified eye, stage unspecified: Secondary | ICD-10-CM

## 2013-08-31 DIAGNOSIS — H3530 Unspecified macular degeneration: Secondary | ICD-10-CM

## 2013-08-31 DIAGNOSIS — H35039 Hypertensive retinopathy, unspecified eye: Secondary | ICD-10-CM

## 2013-08-31 DIAGNOSIS — H43819 Vitreous degeneration, unspecified eye: Secondary | ICD-10-CM

## 2013-08-31 DIAGNOSIS — H2510 Age-related nuclear cataract, unspecified eye: Secondary | ICD-10-CM

## 2013-09-18 ENCOUNTER — Ambulatory Visit (INDEPENDENT_AMBULATORY_CARE_PROVIDER_SITE_OTHER): Payer: Medicare Other | Admitting: Family Medicine

## 2013-09-18 ENCOUNTER — Encounter: Payer: Self-pay | Admitting: Family Medicine

## 2013-09-18 VITALS — BP 120/56 | HR 64 | Temp 97.0°F | Ht 63.0 in | Wt 113.0 lb

## 2013-09-18 DIAGNOSIS — N390 Urinary tract infection, site not specified: Secondary | ICD-10-CM

## 2013-09-18 DIAGNOSIS — M549 Dorsalgia, unspecified: Secondary | ICD-10-CM

## 2013-09-18 LAB — POCT UA - MICROSCOPIC ONLY
Bacteria, U Microscopic: NEGATIVE
Casts, Ur, LPF, POC: NEGATIVE
Crystals, Ur, HPF, POC: NEGATIVE
Mucus, UA: NEGATIVE
RBC, urine, microscopic: NEGATIVE
Yeast, UA: NEGATIVE

## 2013-09-18 LAB — POCT URINALYSIS DIPSTICK
Bilirubin, UA: NEGATIVE
Blood, UA: NEGATIVE
Glucose, UA: NEGATIVE
Ketones, UA: NEGATIVE
Nitrite, UA: NEGATIVE
Protein, UA: NEGATIVE
Spec Grav, UA: 1.02
Urobilinogen, UA: NEGATIVE
pH, UA: 6.5

## 2013-09-18 MED ORDER — TRAMADOL HCL 50 MG PO TABS: 50.0000 mg | ORAL_TABLET | Freq: Three times a day (TID) | ORAL | Status: DC | PRN

## 2013-09-18 MED ORDER — NITROFURANTOIN MONOHYD MACRO 100 MG PO CAPS: 100.0000 mg | ORAL_CAPSULE | Freq: Two times a day (BID) | ORAL | Status: DC

## 2013-09-18 NOTE — Progress Notes (Signed)
  Subjective:    Patient ID: Kristen Hogan, female    DOB: 14-Nov-1922, 79 y.o.   MRN: [id number]  HPI This 79 y.o. female presents for evaluation of having dysuria. She is c/o severe back pain.  She has hx of OA and was taking Arthritis meds but she has Ckd and she is unable to take  NSAIDS and so her pain is worse.  Review of Systems C/o right back pain and dysuria   No chest pain, SOB, HA, dizziness, vision change, N/V, diarrhea, constipation, myalgias, arthralgias or rash.  Objective:   Physical Exam  Vital signs noted  Chronically ill appearing female  HEENT - Head atraumatic Normocephalic                Eyes - PERRLA, Conjuctiva - clear Sclera- Clear EOMI                Ears - EAC's Wnl TM's Wnl Gross Hearing WNL                Nose - Nares patent                 Throat - oropharanx wnl Respiratory - Lungs CTA bilateral Cardiac - RRR S1 and S2 without murmur GI - Abdomen soft Nontender and bowel sounds active x 4 Extremities - No edema. Neuro - Grossly intact.  Results for orders placed in visit on 09/18/13  POCT URINALYSIS DIPSTICK      Result Value Range   Color, UA yellow     Clarity, UA cloudy     Glucose, UA neg     Bilirubin, UA neg     Ketones, UA neg     Spec Grav, UA 1.020     Blood, UA neg     pH, UA 6.5     Protein, UA neg     Urobilinogen, UA negative     Nitrite, UA neg     Leukocytes, UA Trace    POCT UA - MICROSCOPIC ONLY      Result Value Range   WBC, Ur, HPF, POC 1-5     RBC, urine, microscopic neg     Bacteria, U Microscopic neg     Mucus, UA neg     Epithelial cells, urine per micros occ     Crystals, Ur, HPF, POC neg     Casts, Ur, LPF, POC neg     Yeast, UA neg        Assessment & Plan:  Back pain - Plan: POCT urinalysis dipstick, POCT UA - Microscopic Only, traMADol (ULTRAM) 50 MG tablet  UTI (urinary tract infection) - Plan: nitrofurantoin, macrocrystal-monohydrate, (MACROBID) 100 MG capsule  Deatra Canter FNP

## 2013-09-18 NOTE — Patient Instructions (Signed)
Urinary Tract Infection  Urinary tract infections (UTIs) can develop anywhere along your urinary tract. Your urinary tract is your body's drainage system for removing wastes and extra water. Your urinary tract includes two kidneys, two ureters, a bladder, and a urethra. Your kidneys are a pair of bean-shaped organs. Each kidney is about the size of your fist. They are located below your ribs, one on each side of your spine.  CAUSES  Infections are caused by microbes, which are microscopic organisms, including fungi, viruses, and bacteria. These organisms are so small that they can only be seen through a microscope. Bacteria are the microbes that most commonly cause UTIs.  SYMPTOMS   Symptoms of UTIs may vary by age and gender of the patient and by the location of the infection. Symptoms in young women typically include a frequent and intense urge to urinate and a painful, burning feeling in the bladder or urethra during urination. Older women and men are more likely to be tired, shaky, and weak and have muscle aches and abdominal pain. A fever may mean the infection is in your kidneys. Other symptoms of a kidney infection include pain in your back or sides below the ribs, nausea, and vomiting.  DIAGNOSIS  To diagnose a UTI, your caregiver will ask you about your symptoms. Your caregiver also will ask to provide a urine sample. The urine sample will be tested for bacteria and white blood cells. White blood cells are made by your body to help fight infection.  TREATMENT   Typically, UTIs can be treated with medication. Because most UTIs are caused by a bacterial infection, they usually can be treated with the use of antibiotics. The choice of antibiotic and length of treatment depend on your symptoms and the type of bacteria causing your infection.  HOME CARE INSTRUCTIONS   If you were prescribed antibiotics, take them exactly as your caregiver instructs you. Finish the medication even if you feel better after you  have only taken some of the medication.   Drink enough water and fluids to keep your urine clear or pale yellow.   Avoid caffeine, tea, and carbonated beverages. They tend to irritate your bladder.   Empty your bladder often. Avoid holding urine for long periods of time.   Empty your bladder before and after sexual intercourse.   After a bowel movement, women should cleanse from front to back. Use each tissue only once.  SEEK MEDICAL CARE IF:    You have back pain.   You develop a fever.   Your symptoms do not begin to resolve within 3 days.  SEEK IMMEDIATE MEDICAL CARE IF:    You have severe back pain or lower abdominal pain.   You develop chills.   You have nausea or vomiting.   You have continued burning or discomfort with urination.  MAKE SURE YOU:    Understand these instructions.   Will watch your condition.   Will get help right away if you are not doing well or get worse.  Document Released: 08/12/2005 Document Revised: 05/03/2012 Document Reviewed: 12/11/2011  [employer] Patient Information 2014 [employer].

## 2013-09-22 ENCOUNTER — Ambulatory Visit (INDEPENDENT_AMBULATORY_CARE_PROVIDER_SITE_OTHER): Payer: Medicare Other | Admitting: Family Medicine

## 2013-09-22 ENCOUNTER — Encounter: Payer: Self-pay | Admitting: Family Medicine

## 2013-09-22 VITALS — BP 132/60 | HR 81 | Temp 97.8°F | Ht 63.0 in | Wt 112.0 lb

## 2013-09-22 DIAGNOSIS — J441 Chronic obstructive pulmonary disease with (acute) exacerbation: Secondary | ICD-10-CM

## 2013-09-22 DIAGNOSIS — R110 Nausea: Secondary | ICD-10-CM

## 2013-09-22 DIAGNOSIS — R079 Chest pain, unspecified: Secondary | ICD-10-CM

## 2013-09-22 MED ORDER — PREDNISONE 10 MG PO TABS: ORAL_TABLET | ORAL | Status: DC

## 2013-09-22 MED ORDER — METHYLPREDNISOLONE ACETATE 80 MG/ML IJ SUSP
80.0000 mg | Freq: Once | INTRAMUSCULAR | Status: AC
  Administered 2013-09-22: 80 mg via INTRAMUSCULAR

## 2013-09-22 MED ORDER — PROMETHAZINE HCL 25 MG PO TABS: 25.0000 mg | ORAL_TABLET | Freq: Three times a day (TID) | ORAL | Status: DC | PRN

## 2013-09-22 MED ORDER — AMOXICILLIN 875 MG PO TABS: 875.0000 mg | ORAL_TABLET | Freq: Two times a day (BID) | ORAL | Status: DC

## 2013-09-22 NOTE — Progress Notes (Signed)
  Subjective:    Patient ID: GRAVIELA NODAL, female    DOB: 1921/12/28, 79 y.o.   MRN: [id number]  HPI This 79 y.o. female presents for evaluation of shortness of breath.  She has hx COPD and she is exacerbating.  She has been having some sternal chest pain. She has been coughing and wheezing a lot at night and having difficulty breathing. Her oxygen is 94% on room air after resting but when she was walking back to the Room she had sat 90%.     Review of Systems C/o sob, cough, and chest pain   No HA, dizziness, vision change, N/V, diarrhea, constipation, dysuria, urinary urgency or frequency, myalgias, arthralgias or rash.  Objective:   Physical Exam Vital signs noted  Well developed well nourished female.  HEENT - Head atraumatic Normocephalic                Eyes - PERRLA, Conjuctiva - clear Sclera- Clear EOMI                Ears - EAC's Wnl TM's Wnl Gross Hearing WNL                Nose - Nares patent                 Throat - oropharanx wnl Respiratory - Lungs diminished throughout.  Oxygen 2 liters nasal cannula intact. Cardiac - RRR S1 and S2 without murmur GI - Abdomen soft Nontender and bowel sounds active x 4      Assessment & Plan:  Chest pain - Plan: EKG 12-Lead, methylPREDNISolone acetate (DEPO-MEDROL) injection 80 mg, amoxicillin (AMOXIL) 875 MG tablet, predniSONE (DELTASONE) 10 MG tablet  Nausea - Plan: promethazine (PHENERGAN) 25 MG tablet  COPD exacerbation - Plan: methylPREDNISolone acetate (DEPO-MEDROL) injection 80 mg, amoxicillin (AMOXIL) 875 MG tablet, predniSONE (DELTASONE) 10 MG tablet  Deatra Canter FNP

## 2013-09-22 NOTE — Patient Instructions (Signed)

## 2013-09-28 ENCOUNTER — Encounter (INDEPENDENT_AMBULATORY_CARE_PROVIDER_SITE_OTHER): Payer: Medicare Other | Admitting: Ophthalmology

## 2013-10-26 ENCOUNTER — Encounter (INDEPENDENT_AMBULATORY_CARE_PROVIDER_SITE_OTHER): Payer: Medicare Other | Admitting: Ophthalmology

## 2013-10-26 DIAGNOSIS — H353290 Exudative age-related macular degeneration, unspecified eye, stage unspecified: Secondary | ICD-10-CM

## 2013-10-26 DIAGNOSIS — I10 Essential (primary) hypertension: Secondary | ICD-10-CM

## 2013-10-26 DIAGNOSIS — H3530 Unspecified macular degeneration: Secondary | ICD-10-CM

## 2013-10-26 DIAGNOSIS — H35039 Hypertensive retinopathy, unspecified eye: Secondary | ICD-10-CM

## 2013-10-26 DIAGNOSIS — H43819 Vitreous degeneration, unspecified eye: Secondary | ICD-10-CM

## 2013-10-27 ENCOUNTER — Ambulatory Visit (INDEPENDENT_AMBULATORY_CARE_PROVIDER_SITE_OTHER): Payer: Medicare Other

## 2013-10-27 ENCOUNTER — Ambulatory Visit (INDEPENDENT_AMBULATORY_CARE_PROVIDER_SITE_OTHER): Payer: Medicare Other | Admitting: Family Medicine

## 2013-10-27 ENCOUNTER — Encounter: Payer: Self-pay | Admitting: Family Medicine

## 2013-10-27 VITALS — BP 134/64 | HR 90 | Temp 97.6°F | Ht 63.0 in | Wt 109.0 lb

## 2013-10-27 DIAGNOSIS — E785 Hyperlipidemia, unspecified: Secondary | ICD-10-CM

## 2013-10-27 DIAGNOSIS — J441 Chronic obstructive pulmonary disease with (acute) exacerbation: Secondary | ICD-10-CM

## 2013-10-27 DIAGNOSIS — R079 Chest pain, unspecified: Secondary | ICD-10-CM

## 2013-10-27 DIAGNOSIS — I10 Essential (primary) hypertension: Secondary | ICD-10-CM

## 2013-10-27 DIAGNOSIS — J189 Pneumonia, unspecified organism: Secondary | ICD-10-CM

## 2013-10-27 LAB — POCT CBC
Granulocyte percent: 83.1 %G — AB (ref 37–80)
HCT, POC: 40.9 % (ref 37.7–47.9)
Hemoglobin: 13.2 g/dL (ref 12.2–16.2)
Lymph, poc: 1.3 (ref 0.6–3.4)
MCH, POC: 29.9 pg (ref 27–31.2)
MCHC: 32.3 g/dL (ref 31.8–35.4)
MCV: 92.7 fL (ref 80–97)
MPV: 7.9 fL (ref 0–99.8)
POC Granulocyte: 8.3 — AB (ref 2–6.9)
POC LYMPH PERCENT: 13 %L (ref 10–50)
Platelet Count, POC: 255 10*3/uL (ref 142–424)
RBC: 4.4 M/uL (ref 4.04–5.48)
RDW, POC: 16.1 %
WBC: 10 10*3/uL (ref 4.6–10.2)

## 2013-10-27 MED ORDER — AMLODIPINE BESYLATE 5 MG PO TABS: 5.0000 mg | ORAL_TABLET | Freq: Every day | ORAL | Status: DC

## 2013-10-27 MED ORDER — AMOXICILLIN 875 MG PO TABS: 875.0000 mg | ORAL_TABLET | Freq: Two times a day (BID) | ORAL | Status: DC

## 2013-10-27 MED ORDER — DILTIAZEM HCL ER COATED BEADS 240 MG PO CP24: 240.0000 mg | ORAL_CAPSULE | Freq: Every day | ORAL | Status: DC

## 2013-10-27 MED ORDER — ATORVASTATIN CALCIUM 20 MG PO TABS: 20.0000 mg | ORAL_TABLET | Freq: Every day | ORAL | Status: DC

## 2013-10-27 MED ORDER — IPRATROPIUM-ALBUTEROL 0.5-2.5 (3) MG/3ML IN SOLN: 3.0000 mL | Freq: Two times a day (BID) | RESPIRATORY_TRACT | Status: AC

## 2013-10-27 MED ORDER — PREDNISONE 10 MG PO TABS: ORAL_TABLET | ORAL | Status: DC

## 2013-10-27 NOTE — Progress Notes (Signed)
   Subjective:    Patient ID: Kristen Hogan, female    DOB: November 21, 1921, 79 y.o.   MRN: [id number]  HPI This 79 y.o. female presents for evaluation of routine follow up. She has hx of COPD and hypoxia And wears nasal cannula oxygen.  She has hx of hypertension and hyperlipidemia.  She is accompanied By her daughter who states she was seen in the ED at [facility] and tx for CAP.  She was rx'd abx's, prednisone, and duoneb.   She was unable to get duoneb medicine.  She is having some cough and congestion.  Review of Systems C/o uri sx's. No chest pain, SOB, HA, dizziness, vision change, N/V, diarrhea, constipation, dysuria, urinary urgency or frequency, myalgias, arthralgias or rash.     Objective:   Physical Exam  Filed Vitals:   10/27/13 0816  BP: 134/64  Pulse: 90  Temp: 97.6 F (36.4 C)  TempSrc: Oral  Height: 5\' 3"  (1.6 m)  Weight: 109 lb (49.442 kg)   Vital signs noted  Elderly chronically ill appearing female with nasal cannula oxygen.  HEENT - Head atraumatic Normocephalic                Eyes - PERRLA, Conjuctiva - clear Sclera- Clear EOMI                Ears - EAC's Wnl TM's Wnl Gross Hearing WNL                Nose - Nares patent with 3 liters nasal cannula oxygen                Throat - oropharanx wnl Respiratory - Lungs CTA bilateral Cardiac - RRR S1 and S2 without murmur GI - Abdomen soft Nontender and bowel sounds active x 4 Extremities - No edema. Neuro - Grossly intact.  CXR - COPD with no infiltrates    Assessment & Plan:  COPD exacerbation - Plan: ipratropium-albuterol (DUONEB) 0.5-2.5 (3) MG/3ML SOLN, DG Chest 2 View, Lipid panel, amoxicillin (AMOXIL) 875 MG tablet, predniSONE (DELTASONE) 10 MG tablet, Ambulatory referral to Pulmonology  Unspecified essential hypertension - Plan: amLODipine (NORVASC) 5 MG tablet, diltiazem (CARDIZEM CD) 240 MG 24 hr capsule   Hyperlipidemia - Plan: atorvastatin (LIPITOR) 20 MG tablet, POCT CBC, CMP14+EGFR,  Lipid panel  Essential hypertension, benign - Plan: amLODipine (NORVASC) 5 MG tablet, diltiazem (CARDIZEM CD) 240 MG 24 hr capsule  CAP (community acquired pneumonia) - Plan: DG Chest 2 View, amoxicillin (AMOXIL) 875 MG tablet, predniSONE (DELTASONE) 10 MG tablet, Ambulatory referral to Pulmonology.  Deatra Canter FNP

## 2013-10-28 LAB — LIPID PANEL
Chol/HDL Ratio: 2.1 ratio units (ref 0.0–4.4)
Cholesterol, Total: 172 mg/dL (ref 100–199)
HDL: 81 mg/dL (ref >39)
LDL Calculated: 68 mg/dL (ref 0–99)
Triglycerides: 115 mg/dL (ref 0–149)
VLDL Cholesterol Cal: 23 mg/dL (ref 5–40)

## 2013-10-28 LAB — CMP14+EGFR
ALT: 12 IU/L (ref 0–32)
AST: 14 IU/L (ref 0–40)
Albumin/Globulin Ratio: 1.6 (ref 1.1–2.5)
Albumin: 4.2 g/dL (ref 3.5–4.7)
Alkaline Phosphatase: 103 IU/L (ref 39–117)
BUN/Creatinine Ratio: 16 (ref 11–26)
BUN: 18 mg/dL (ref 8–27)
CO2: 20 mmol/L (ref 18–29)
Calcium: 10.1 mg/dL (ref 8.6–10.2)
Chloride: 102 mmol/L (ref 97–108)
Creatinine, Ser: 1.1 mg/dL — ABNORMAL HIGH (ref 0.57–1.00)
GFR calc Af Amer: 51 mL/min/{1.73_m2} — ABNORMAL LOW (ref >59)
GFR calc non Af Amer: 45 mL/min/{1.73_m2} — ABNORMAL LOW (ref >59)
Globulin, Total: 2.6 g/dL (ref 1.5–4.5)
Glucose: 105 mg/dL — ABNORMAL HIGH (ref 65–99)
Potassium: 4.1 mmol/L (ref 3.5–5.2)
Sodium: 142 mmol/L (ref 134–144)
Total Bilirubin: 0.4 mg/dL (ref 0.0–1.2)
Total Protein: 6.8 g/dL (ref 6.0–8.5)

## 2013-10-28 NOTE — Patient Instructions (Signed)

## 2013-11-06 ENCOUNTER — Ambulatory Visit (INDEPENDENT_AMBULATORY_CARE_PROVIDER_SITE_OTHER): Payer: Medicare Other | Admitting: Internal Medicine

## 2013-11-06 ENCOUNTER — Encounter: Payer: Self-pay | Admitting: Internal Medicine

## 2013-11-06 VITALS — BP 140/60 | HR 70 | Temp 98.2°F | Ht 61.0 in | Wt 112.0 lb

## 2013-11-06 DIAGNOSIS — J9610 Chronic respiratory failure, unspecified whether with hypoxia or hypercapnia: Secondary | ICD-10-CM | POA: Insufficient documentation

## 2013-11-06 DIAGNOSIS — J449 Chronic obstructive pulmonary disease, unspecified: Secondary | ICD-10-CM

## 2013-11-06 DIAGNOSIS — R0789 Other chest pain: Secondary | ICD-10-CM

## 2013-11-06 MED ORDER — PANTOPRAZOLE SODIUM 40 MG PO TBEC: 40.0000 mg | DELAYED_RELEASE_TABLET | Freq: Every day | ORAL | Status: DC

## 2013-11-06 MED ORDER — FAMOTIDINE 20 MG PO TABS: ORAL_TABLET | ORAL | Status: DC

## 2013-11-06 MED ORDER — FLUTTER DEVI: Status: AC

## 2013-11-06 NOTE — Progress Notes (Signed)
Subjective:    Patient ID: Kristen Hogan, female    DOB: 1922/08/13 .   MRN: [id number]    Brief patient profile:  89 yowf quit smoking 04/2012  with tendency to cough in winter but only mild and not requiring ov's referred by Dr Kathi Der office 09/22/11  for new onset persistent cough since July 2012   History of Present Illness  09/22/2011  Initial pulmonary office eval in EMR era cc indolent onset persistent congested day > night cough x July 2012 prod thick white mucus assoc with L  Ant/lat chest discomfort rx with neb > no better and made her shaky, no better with advair  - pattern was indolent onset persistent, not progressive, tends to resolve supine, no worse with activity and she denies limiting sob though relatively sedentary rec  Stop smoking before it stops you - this is the most important aspect of your care- none of my recommendations will really be effective if you keep smoking. For cough take mucinex dm over the counter For short of breath or congestion, use neb up to every 4  hours Prednisone 10 mg take  4 each am x 2 days,   2 each am x 2 days,  1 each am x2days and stop    11/06/2013 f/u ov/Jaiah Weigel re: copd/ 02 dep Chief Complaint  Patient presents with  . Follow-up    Last seen in 2012. She was referred back here per Nils Pyle, NP after being recently dxed with PNA. Pt c/o increased SOB for the past couple of wks. She has prod cough with minimal clear sptum.   not really wearing 02 @3  lpm  24/7 as rec.  Baseline doe x any outside walking, does ok room to room.  Eating/ sleeping ok  Pain L flank for over a year made worse by coughing, gets choked on mucus.  No obvious day to day or daytime variabilty or assoc chronic cough or cp or chest tightness, subjective wheeze overt sinus or hb symptoms. No unusual exp hx or h/o childhood pna/ asthma or knowledge of premature birth.  Sleeping ok without nocturnal  or early am exacerbation  of respiratory  c/o's or need for noct  saba. Also denies any obvious fluctuation of symptoms with weather or environmental changes or other aggravating or alleviating factors except as outlined above   Current Medications, Allergies, Complete Past Medical History, Past Surgical History, Family History, and Social History were reviewed in [employer] record.  ROS  The following are not active complaints unless bolded sore throat, dysphagia, dental problems, itching, sneezing,  nasal congestion or excess/ purulent secretions, ear ache,   fever, chills, sweats, unintended wt loss, pleuritic or exertional cp, hemoptysis,  orthopnea pnd or leg swelling, presyncope, palpitations, heartburn, abdominal pain, anorexia, nausea, vomiting, diarrhea  or change in bowel or urinary habits, change in stools or urine, dysuria,hematuria,  rash, arthralgias, visual complaints, headache, numbness weakness or ataxia or problems with walking or coordination,  change in mood/affect or memory.                     Objective:   Physical Exam    amb wf with weathered facies and mod  congested cough  Wt 111 09/22/2011 > 11/06/2013   HEENT mild turbinate edema.  Oropharynx edentulous with no thrush or excess pnd or cobblestoning.  No JVD or cervical adenopathy. Mild accessory muscle hypertrophy. Trachea midline, nl thryroid. Chest was hyperinflated by percussion with diminished  breath sounds and moderate increased exp time without wheeze. Hoover sign positive at mid inspiration. Regular rate and rhythm without murmur gallop or rub or increase P2 or edema.  Abd: no hsm, nl excursion. Ext warm without cyanosis or clubbing.   No rash, no chest wall instability or bruising or tenderness    CXR 12/28/12 Findings consistent with chronic obstructive pulmonary disease.  Ill-defined density seen laterally in left midlung which may  represent focal inflammation or scarring; followup radiographs are  recommended.     Assessment & Plan:

## 2013-11-06 NOTE — Patient Instructions (Addendum)
Avoid macrodantin   We will contact [facility] to make sure they evaluate your portable system  for you should use 2lpm 24/7 but 3lpm with any activity more than just going across the room  Zostrix cream (over the counter) may help chest wall pain but need to apply it 4 x daily until better then just at bedtime  Pantoprazole (protonix) 40 mg   Take 30-60 min before first meal of the day and Pepcid 20 mg one bedtime x one month to see if helps cough or pain  For cough mucinex 1200 mg up to every 12 hours plus flutter plus use Tramadol up to 1 every 4 hours for severe cough or painful coughing   Follow up is as needed

## 2013-11-06 NOTE — Progress Notes (Deleted)
Subjective:     Patient ID: Kristen Hogan, female   DOB: 19-Jul-1922, 79 y.o.   MRN: [id number]  HPI    11/06/2013 f/u ov/Jory Welke re: 02 dep copd  Chief Complaint  Patient presents with  . Follow-up    Last seen in 2012. She was referred back here per Nils Pyle, NP after being recently dxed with PNA. Pt c/o increased SOB for the past couple of wks. She has prod cough with minimal clear sptum.   Now requiring 3lpm 24/7 whereas as prev on 02 just at hs  Still walking to mb and back  Eats and sleeps ok  Complains re use of portable 02 [facility]       Review of Systems     Objective:   Physical Exam     Assessment:

## 2013-11-08 DIAGNOSIS — R0789 Other chest pain: Secondary | ICD-10-CM | POA: Insufficient documentation

## 2013-11-08 NOTE — Assessment & Plan Note (Addendum)
DDX of  difficult airways managment all start with A and  include Adherence, Ace Inhibitors, Acid Reflux, Active Sinus Disease, Alpha 1 Antitripsin deficiency, Anxiety masquerading as Airways dz,  ABPA,  allergy(esp in young), Aspiration (esp in elderly), Adverse effects of DPI,  Active smokers, plus two Bs  = Bronchiectasis and Beta blocker use..and one C= CHF  Adherence is always the initial "prime suspect" and is a multilayered concern that requires a "trust but verify" approach in every patient - starting with knowing how to use medications, especially inhalers, correctly, keeping up with refills and understanding the fundamental difference between maintenance and prns vs those medications only taken for a very short course and then stopped and not refilled.   ? Acid (or non-acid) GERD > always difficult to exclude as up to 75% of pts in some series report no assoc GI/ Heartburn symptoms> rec max (24h)  acid suppression and diet restrictions/ reviewed and instructions given in writing.   Needs to use duoneb and flutter and f/u here prn

## 2013-11-08 NOTE — Assessment & Plan Note (Signed)
Present for over a year with no assoc effusion or explanation for the pain on cxr reviewed from 10/27/13  rec trial of zostrix cream

## 2013-11-08 NOTE — Assessment & Plan Note (Signed)
-   11/06/2013  Walked 3lpm x 3 laps @ 185 ft each stopped due to end of test, sats 93% and  At rest on 2lpm 96%  Adequate control on present rx, reviewed > no change in rx needed

## 2013-11-23 ENCOUNTER — Encounter (INDEPENDENT_AMBULATORY_CARE_PROVIDER_SITE_OTHER): Payer: Medicare Other | Admitting: Ophthalmology

## 2013-11-23 DIAGNOSIS — H43819 Vitreous degeneration, unspecified eye: Secondary | ICD-10-CM

## 2013-11-23 DIAGNOSIS — H3530 Unspecified macular degeneration: Secondary | ICD-10-CM

## 2013-11-23 DIAGNOSIS — I10 Essential (primary) hypertension: Secondary | ICD-10-CM

## 2013-11-23 DIAGNOSIS — H35039 Hypertensive retinopathy, unspecified eye: Secondary | ICD-10-CM

## 2013-11-23 DIAGNOSIS — H353290 Exudative age-related macular degeneration, unspecified eye, stage unspecified: Secondary | ICD-10-CM

## 2013-12-20 ENCOUNTER — Encounter (INDEPENDENT_AMBULATORY_CARE_PROVIDER_SITE_OTHER): Payer: Medicare Other | Admitting: Ophthalmology

## 2013-12-20 DIAGNOSIS — H353290 Exudative age-related macular degeneration, unspecified eye, stage unspecified: Secondary | ICD-10-CM

## 2013-12-20 DIAGNOSIS — H43819 Vitreous degeneration, unspecified eye: Secondary | ICD-10-CM

## 2013-12-20 DIAGNOSIS — H35039 Hypertensive retinopathy, unspecified eye: Secondary | ICD-10-CM

## 2013-12-20 DIAGNOSIS — H3530 Unspecified macular degeneration: Secondary | ICD-10-CM

## 2013-12-20 DIAGNOSIS — H2510 Age-related nuclear cataract, unspecified eye: Secondary | ICD-10-CM

## 2013-12-20 DIAGNOSIS — I10 Essential (primary) hypertension: Secondary | ICD-10-CM

## 2013-12-29 ENCOUNTER — Encounter: Payer: Self-pay | Admitting: Family Medicine

## 2013-12-29 ENCOUNTER — Ambulatory Visit (INDEPENDENT_AMBULATORY_CARE_PROVIDER_SITE_OTHER): Payer: Medicare Other | Admitting: Family Medicine

## 2013-12-29 VITALS — BP 123/57 | HR 91 | Temp 97.2°F | Ht 63.0 in | Wt 114.2 lb

## 2013-12-29 DIAGNOSIS — R5383 Other fatigue: Secondary | ICD-10-CM

## 2013-12-29 DIAGNOSIS — M129 Arthropathy, unspecified: Secondary | ICD-10-CM

## 2013-12-29 DIAGNOSIS — R5381 Other malaise: Secondary | ICD-10-CM

## 2013-12-29 DIAGNOSIS — M1990 Unspecified osteoarthritis, unspecified site: Secondary | ICD-10-CM

## 2013-12-29 LAB — POCT URINALYSIS DIPSTICK
Bilirubin, UA: NEGATIVE
Blood, UA: NEGATIVE
Glucose, UA: NEGATIVE
Ketones, UA: NEGATIVE
Nitrite, UA: NEGATIVE
Protein, UA: NEGATIVE
Spec Grav, UA: <=1.005
Urobilinogen, UA: NEGATIVE
pH, UA: 6.5

## 2013-12-29 LAB — POCT UA - MICROSCOPIC ONLY
Bacteria, U Microscopic: NEGATIVE
Casts, Ur, LPF, POC: NEGATIVE
Crystals, Ur, HPF, POC: NEGATIVE
Mucus, UA: NEGATIVE
RBC, urine, microscopic: NEGATIVE
Yeast, UA: NEGATIVE

## 2013-12-29 MED ORDER — HYDROCODONE-ACETAMINOPHEN 5-325 MG PO TABS: 1.0000 | ORAL_TABLET | Freq: Four times a day (QID) | ORAL | Status: DC | PRN

## 2013-12-29 MED ORDER — MELOXICAM 7.5 MG PO TABS: 7.5000 mg | ORAL_TABLET | Freq: Every day | ORAL | Status: DC

## 2013-12-29 NOTE — Progress Notes (Signed)
   Subjective:    Patient ID: KEVIA ZAUCHA, female    DOB: 26-Feb-1922, 80 y.o.   MRN: [id number]  HPI This 80 y.o. female presents for evaluation of generalized aches and pains.  She c/o Arthritis.  She has a skin cancer on her left temple region and she doesn't want anything done. She did have dermatology appointment and didn't show for it.  She is accompanied by her  Daughter who states she doesn't want to see Derm..   Review of Systems No chest pain, SOB, HA, dizziness, vision change, N/V, diarrhea, constipation, dysuria, urinary urgency or frequency, myalgias, arthralgias or rash.     Objective:   Physical Exam Vital signs noted  Chronically ill appearing female.  HEENT - Head atraumatic Normocephalic                Eyes - PERRLA, Conjuctiva - clear Sclera- Clear EOMI                Ears - EAC's Wnl TM's Wnl Gross Hearing WNL                Throat - oropharanx wnl Respiratory - Lungs CTA bilateral Cardiac - RRR S1 and S2 without murmur        Assessment & Plan:  Arthritis - Plan: meloxicam (MOBIC) 7.5 MG tablet, HYDROcodone-acetaminophen (NORCO) 5-325 MG per tablet  Other malaise and fatigue - Plan: POCT urinalysis dipstick, POCT UA - Microscopic Only  Lysbeth Penner FNP

## 2014-01-08 ENCOUNTER — Telehealth: Payer: Self-pay | Admitting: Family Medicine

## 2014-01-08 NOTE — Telephone Encounter (Signed)
I spoke with  Caregiver and she stated pt had chest pain x 2 days and shortness of breath. Hollering with Chest pain last night. I advised to call EMS now to take pt to hospital. Caregiver verbalized understanding.

## 2014-01-17 ENCOUNTER — Telehealth: Payer: Self-pay | Admitting: Family Medicine

## 2014-01-17 ENCOUNTER — Encounter (INDEPENDENT_AMBULATORY_CARE_PROVIDER_SITE_OTHER): Payer: Medicare Other | Admitting: Ophthalmology

## 2014-01-17 ENCOUNTER — Other Ambulatory Visit: Payer: Self-pay | Admitting: *Deleted

## 2014-01-18 ENCOUNTER — Other Ambulatory Visit: Payer: Self-pay | Admitting: Family Medicine

## 2014-01-18 ENCOUNTER — Ambulatory Visit: Payer: Medicare Other | Admitting: Family Medicine

## 2014-01-18 DIAGNOSIS — M1990 Unspecified osteoarthritis, unspecified site: Secondary | ICD-10-CM

## 2014-01-18 NOTE — Telephone Encounter (Signed)
In refill request

## 2014-01-22 ENCOUNTER — Telehealth: Payer: Self-pay | Admitting: Family Medicine

## 2014-01-22 ENCOUNTER — Other Ambulatory Visit: Payer: Self-pay

## 2014-01-22 DIAGNOSIS — M1990 Unspecified osteoarthritis, unspecified site: Secondary | ICD-10-CM

## 2014-01-22 NOTE — Telephone Encounter (Signed)
Last seen 12/29/13  JWO  If approved route to nurse and print

## 2014-01-22 NOTE — Telephone Encounter (Signed)
appt made

## 2014-01-23 ENCOUNTER — Encounter: Payer: Self-pay | Admitting: Family Medicine

## 2014-01-23 ENCOUNTER — Ambulatory Visit (INDEPENDENT_AMBULATORY_CARE_PROVIDER_SITE_OTHER): Payer: Medicare Other | Admitting: Family Medicine

## 2014-01-23 VITALS — BP 118/62 | HR 76 | Temp 98.3°F | Ht 63.0 in | Wt 109.2 lb

## 2014-01-23 DIAGNOSIS — B029 Zoster without complications: Secondary | ICD-10-CM

## 2014-01-23 MED ORDER — ACYCLOVIR 800 MG PO TABS: 800.0000 mg | ORAL_TABLET | Freq: Every day | ORAL | Status: DC

## 2014-01-23 MED ORDER — PREDNISONE 10 MG PO TABS
ORAL_TABLET | ORAL | Status: DC
Start: 2014-01-23 — End: 2014-01-29

## 2014-01-23 MED ORDER — HYDROCODONE-ACETAMINOPHEN 5-325 MG PO TABS: 1.0000 | ORAL_TABLET | Freq: Four times a day (QID) | ORAL | Status: DC | PRN

## 2014-01-23 NOTE — Progress Notes (Signed)
   Subjective:    Patient ID: Kristen Hogan, female    DOB: 10-Jan-1922, 80 y.o.   MRN: [id number]  HPI  This 80 y.o. female presents for evaluation of painful rash on right breast and back.  Review of Systems C/o rash   No chest pain, SOB, HA, dizziness, vision change, N/V, diarrhea, constipation, dysuria, urinary urgency or frequency, myalgias, arthralgias.  Objective:   Physical Exam  GEN - elderly female in NAD Lungs - CTA bilateral Card - RRR s1 and s2 w/o murmur Erythematous rash scattered along chest and back on ride side      Assessment & Plan:  Shingles - Plan: predniSONE (DELTASONE) 10 MG tablet, acyclovir (ZOVIRAX) 800 MG tablet Take hydrocodone and follow up prn  Lysbeth Penner FNP

## 2014-01-29 ENCOUNTER — Telehealth: Payer: Self-pay | Admitting: Family Medicine

## 2014-01-29 ENCOUNTER — Ambulatory Visit (INDEPENDENT_AMBULATORY_CARE_PROVIDER_SITE_OTHER): Payer: Medicare Other | Admitting: Family Medicine

## 2014-01-29 ENCOUNTER — Encounter: Payer: Self-pay | Admitting: Family Medicine

## 2014-01-29 ENCOUNTER — Ambulatory Visit: Payer: Medicare Other | Admitting: Family Medicine

## 2014-01-29 VITALS — BP 117/54 | HR 80 | Temp 98.1°F | Ht 63.0 in | Wt 105.6 lb

## 2014-01-29 DIAGNOSIS — B029 Zoster without complications: Secondary | ICD-10-CM

## 2014-01-29 DIAGNOSIS — M1990 Unspecified osteoarthritis, unspecified site: Secondary | ICD-10-CM

## 2014-01-29 DIAGNOSIS — M129 Arthropathy, unspecified: Secondary | ICD-10-CM

## 2014-01-29 MED ORDER — LIDOCAINE 5 % EX PTCH: 3.0000 | MEDICATED_PATCH | CUTANEOUS | Status: DC

## 2014-01-29 MED ORDER — HYDROCODONE-ACETAMINOPHEN 5-325 MG PO TABS: 1.0000 | ORAL_TABLET | Freq: Four times a day (QID) | ORAL | Status: DC | PRN

## 2014-01-29 NOTE — Telephone Encounter (Signed)
APPT MADE

## 2014-01-29 NOTE — Progress Notes (Signed)
   Subjective:    Patient ID: JOZY MCPHEARSON, female    DOB: 1922-02-11, 80 y.o.   MRN: [id number]  HPI  This 80 y.o. female presents for evaluation of follow up on shingles.  She is having severe pain and is Out of pain medication.  She was tx'd with acyclovir and norco.  Review of Systems C/o rash   No chest pain, SOB, HA, dizziness, vision change, N/V, diarrhea, constipation, dysuria, urinary urgency or frequency, myalgias, arthralgias.  Objective:   Physical Exam Vital signs noted  Well developed well nourished female.  HEENT - Head atraumatic Normocephalic                Eyes - PERRLA, Conjuctiva - clear Sclera- Clear EOMI                Ears - EAC's Wnl TM's Wnl Gross Hearing WNL                Throat - oropharanx wnl Respiratory - Lungs CTA bilateral Cardiac - RRR S1 and S2 without murmur Skin - Rash on right back, right lateral chest wall, and right breast     Assessment & Plan:   Shingles - Plan: HYDROcodone-acetaminophen (NORCO) 5-325 MG per tablet, lidocaine (LIDODERM) 5 %.  Instructed to use 3 patches 12 hours on and 12 hours off  Follow up prn  Lysbeth Penner FNP

## 2014-01-31 ENCOUNTER — Other Ambulatory Visit: Payer: Self-pay | Admitting: Internal Medicine

## 2014-02-01 ENCOUNTER — Telehealth: Payer: Self-pay | Admitting: Family Medicine

## 2014-02-01 ENCOUNTER — Other Ambulatory Visit: Payer: Self-pay | Admitting: *Deleted

## 2014-02-01 NOTE — Telephone Encounter (Signed)
Patient requesting med for itching. States that she was given a rx visit before last for something she takes for 5 days and she is no better. Please advise

## 2014-02-02 ENCOUNTER — Other Ambulatory Visit: Payer: Self-pay | Admitting: Family Medicine

## 2014-02-02 MED ORDER — HYDROXYZINE HCL 25 MG PO TABS: 25.0000 mg | ORAL_TABLET | Freq: Three times a day (TID) | ORAL | Status: DC | PRN

## 2014-02-02 NOTE — Telephone Encounter (Signed)
rx for atarax sent to pharmacy for itching follow up if not better.  Tx for shingles last visit.p

## 2014-02-08 ENCOUNTER — Other Ambulatory Visit: Payer: Self-pay | Admitting: Family Medicine

## 2014-02-13 ENCOUNTER — Other Ambulatory Visit: Payer: Self-pay | Admitting: *Deleted

## 2014-02-13 DIAGNOSIS — B029 Zoster without complications: Secondary | ICD-10-CM

## 2014-02-13 DIAGNOSIS — M1990 Unspecified osteoarthritis, unspecified site: Secondary | ICD-10-CM

## 2014-02-13 NOTE — Telephone Encounter (Signed)
This was just filled for #60 on 01/30/14, but pt can take up to 8 per day per instructions. Pt is 80 yo with shingles, call daughter to pickup Rx

## 2014-02-14 MED ORDER — HYDROCODONE-ACETAMINOPHEN 5-325 MG PO TABS: 1.0000 | ORAL_TABLET | Freq: Four times a day (QID) | ORAL | Status: DC | PRN

## 2014-03-01 ENCOUNTER — Ambulatory Visit (INDEPENDENT_AMBULATORY_CARE_PROVIDER_SITE_OTHER): Payer: Medicare Other | Admitting: Family Medicine

## 2014-03-01 ENCOUNTER — Encounter: Payer: Self-pay | Admitting: Family Medicine

## 2014-03-01 VITALS — BP 98/61 | HR 87 | Temp 98.5°F | Ht 63.0 in | Wt 104.6 lb

## 2014-03-01 DIAGNOSIS — M129 Arthropathy, unspecified: Secondary | ICD-10-CM

## 2014-03-01 DIAGNOSIS — B029 Zoster without complications: Secondary | ICD-10-CM

## 2014-03-01 DIAGNOSIS — M1990 Unspecified osteoarthritis, unspecified site: Secondary | ICD-10-CM

## 2014-03-01 MED ORDER — MEGESTROL ACETATE 400 MG/10ML PO SUSP: 800.0000 mg | Freq: Every day | ORAL | Status: DC

## 2014-03-01 MED ORDER — AMITRIPTYLINE HCL 10 MG PO TABS: 10.0000 mg | ORAL_TABLET | Freq: Every day | ORAL | Status: DC

## 2014-03-01 MED ORDER — HYDROCODONE-ACETAMINOPHEN 5-325 MG PO TABS: 1.0000 | ORAL_TABLET | Freq: Four times a day (QID) | ORAL | Status: DC | PRN

## 2014-03-01 NOTE — Progress Notes (Signed)
   Subjective:    Patient ID: Kristen Hogan, female    DOB: May 19, 1922, 80 y.o.   MRN: [id number]  HPI This 80 y.o. female presents for evaluation of severe pain in her left back and wrapping around to her left chest.  She has been suffering from herpetic neuralgia and she needs refill on her pain meds. She is not eating well.   Review of Systems C/o back and chest wall pain No chest pain, SOB, HA, dizziness, vision change, N/V, diarrhea, constipation, dysuria, urinary urgency or frequency, myalgias, arthralgias or rash.     Objective:   Physical Exam  Vital signs noted  Well developed well nourished female.  HEENT - Head atraumatic Normocephalic                Eyes - PERRLA, Conjuctiva - clear Sclera- Clear EOMI                Ears - EAC's Wnl TM's Wnl Gross Hearing WNL                 Throat - oropharanx wnl Respiratory - Lungs CTA bilateral Cardiac - RRR S1 and S2 without murmur GI - Abdomen soft Nontender and bowel sounds active x 4 Extremities - No edema. Neuro - Grossly intact.      Assessment & Plan:  Arthritis - Plan: HYDROcodone-acetaminophen (NORCO) 5-325 MG per tablet, amitriptyline (ELAVIL) 10 MG tablet, megestrol (MEGACE) 400 MG/10ML suspension  Shingles - Plan: HYDROcodone-acetaminophen (NORCO) 5-325 MG per tablet, amitriptyline (ELAVIL) 10 MG tablet, megestrol (MEGACE) 400 MG/10ML suspension  Post herpetic neuralgia - Amitriptyline 10mg  po qhs  Lysbeth Penner FNP

## 2014-03-07 ENCOUNTER — Telehealth: Payer: Self-pay | Admitting: Family Medicine

## 2014-03-07 ENCOUNTER — Other Ambulatory Visit: Payer: Self-pay | Admitting: Family Medicine

## 2014-03-08 ENCOUNTER — Other Ambulatory Visit: Payer: Self-pay | Admitting: Family Medicine

## 2014-03-08 DIAGNOSIS — M1990 Unspecified osteoarthritis, unspecified site: Secondary | ICD-10-CM

## 2014-03-08 DIAGNOSIS — B029 Zoster without complications: Secondary | ICD-10-CM

## 2014-03-08 MED ORDER — HYDROCODONE-ACETAMINOPHEN 5-325 MG PO TABS: 1.0000 | ORAL_TABLET | Freq: Four times a day (QID) | ORAL | Status: DC | PRN

## 2014-03-11 ENCOUNTER — Other Ambulatory Visit: Payer: Self-pay | Admitting: Family Medicine

## 2014-03-16 ENCOUNTER — Other Ambulatory Visit: Payer: Self-pay | Admitting: Family Medicine

## 2014-03-16 DIAGNOSIS — B029 Zoster without complications: Secondary | ICD-10-CM

## 2014-03-16 DIAGNOSIS — M1990 Unspecified osteoarthritis, unspecified site: Secondary | ICD-10-CM

## 2014-03-16 MED ORDER — HYDROCODONE-ACETAMINOPHEN 5-325 MG PO TABS: 1.0000 | ORAL_TABLET | Freq: Four times a day (QID) | ORAL | Status: DC | PRN

## 2014-03-16 NOTE — Telephone Encounter (Signed)
Pt notified RX to front for pick up Verbalizes understanding

## 2014-03-16 NOTE — Addendum Note (Signed)
Addended by: Ilean China on: 03/16/2014 04:23 PM   Modules accepted: Orders

## 2014-03-16 NOTE — Telephone Encounter (Signed)
Says it was printed on 03/08/14?

## 2014-03-24 ENCOUNTER — Other Ambulatory Visit: Payer: Self-pay | Admitting: Internal Medicine

## 2014-03-28 ENCOUNTER — Other Ambulatory Visit: Payer: Self-pay | Admitting: Family Medicine

## 2014-03-29 ENCOUNTER — Telehealth: Payer: Self-pay | Admitting: Family Medicine

## 2014-03-29 NOTE — Telephone Encounter (Signed)
Appointment made with [facility] 03/30/14

## 2014-03-30 ENCOUNTER — Encounter: Payer: Self-pay | Admitting: Family Medicine

## 2014-03-30 ENCOUNTER — Ambulatory Visit: Payer: Self-pay | Admitting: Family Medicine

## 2014-03-30 ENCOUNTER — Ambulatory Visit (INDEPENDENT_AMBULATORY_CARE_PROVIDER_SITE_OTHER): Payer: Medicare Other | Admitting: Family Medicine

## 2014-03-30 VITALS — BP 111/50 | HR 77 | Temp 98.8°F | Ht 63.0 in | Wt 103.0 lb

## 2014-03-30 DIAGNOSIS — B029 Zoster without complications: Secondary | ICD-10-CM

## 2014-03-30 DIAGNOSIS — J441 Chronic obstructive pulmonary disease with (acute) exacerbation: Secondary | ICD-10-CM

## 2014-03-30 DIAGNOSIS — J189 Pneumonia, unspecified organism: Secondary | ICD-10-CM

## 2014-03-30 DIAGNOSIS — Z23 Encounter for immunization: Secondary | ICD-10-CM

## 2014-03-30 DIAGNOSIS — M129 Arthropathy, unspecified: Secondary | ICD-10-CM

## 2014-03-30 DIAGNOSIS — M1990 Unspecified osteoarthritis, unspecified site: Secondary | ICD-10-CM

## 2014-03-30 MED ORDER — KETOROLAC TROMETHAMINE 30 MG/ML IJ SOLN: 30.0000 mg | Freq: Once | INTRAMUSCULAR | Status: AC

## 2014-03-30 MED ORDER — HYDROCODONE-ACETAMINOPHEN 5-325 MG PO TABS: 1.0000 | ORAL_TABLET | Freq: Four times a day (QID) | ORAL | Status: DC | PRN

## 2014-03-30 MED ORDER — AMOXICILLIN 875 MG PO TABS: 875.0000 mg | ORAL_TABLET | Freq: Two times a day (BID) | ORAL | Status: DC

## 2014-03-30 NOTE — Progress Notes (Signed)
   Subjective:    Patient ID: Kristen Hogan, female    DOB: Jul 25, 1922, 80 y.o.   MRN: [id number]  HPI This 80 y.o. female presents for evaluation of right axillary chest wall pain and URI sx's.  She has been having problems with post herpetic neuralgia and pain.  She has hx of Copd.   Review of Systems C/o right chest wall pain No chest pain, SOB, HA, dizziness, vision change, N/V, diarrhea, constipation, dysuria, urinary urgency or frequency or rash.     Objective:   Physical Exam   Vital signs noted  Chronically ill appearing elderly female in pain  HEENT - Head atraumatic Normocephalic                Eyes - PERRLA, Conjuctiva - clear Sclera- Clear EOMI                Ears - EAC's Wnl TM's Wnl Gross Hearing WNL                Throat - oropharanx wnl Respiratory - Lungs CTA bilateral Cardiac - RRR S1 and S2 without murmur GI - Abdomen soft Nontender and bowel sounds active x 4    Assessment & Plan:  Arthritis - Plan: HYDROcodone-acetaminophen (NORCO) 5-325 MG per tablet, ketorolac (TORADOL) 30 MG/ML injection 30 mg  Shingles - Plan: HYDROcodone-acetaminophen (NORCO) 5-325 MG per tablet, ketorolac (TORADOL) 30 MG/ML injection 30 mg  COPD exacerbation - Plan: amoxicillin (AMOXIL) 875 MG tablet  URI - Amoxicillin 875mg  po bid x 10 days #20 Lysbeth Penner FNP

## 2014-04-10 ENCOUNTER — Other Ambulatory Visit: Payer: Self-pay | Admitting: Family Medicine

## 2014-04-10 DIAGNOSIS — M1990 Unspecified osteoarthritis, unspecified site: Secondary | ICD-10-CM

## 2014-04-10 DIAGNOSIS — B029 Zoster without complications: Secondary | ICD-10-CM

## 2014-04-10 MED ORDER — HYDROCODONE-ACETAMINOPHEN 5-325 MG PO TABS: 1.0000 | ORAL_TABLET | Freq: Four times a day (QID) | ORAL | Status: DC | PRN

## 2014-04-12 ENCOUNTER — Encounter (INDEPENDENT_AMBULATORY_CARE_PROVIDER_SITE_OTHER): Payer: Medicare Other | Admitting: Ophthalmology

## 2014-04-12 DIAGNOSIS — H353290 Exudative age-related macular degeneration, unspecified eye, stage unspecified: Secondary | ICD-10-CM

## 2014-04-12 DIAGNOSIS — H35039 Hypertensive retinopathy, unspecified eye: Secondary | ICD-10-CM

## 2014-04-12 DIAGNOSIS — H43819 Vitreous degeneration, unspecified eye: Secondary | ICD-10-CM

## 2014-04-12 DIAGNOSIS — H2510 Age-related nuclear cataract, unspecified eye: Secondary | ICD-10-CM

## 2014-04-12 DIAGNOSIS — H3530 Unspecified macular degeneration: Secondary | ICD-10-CM

## 2014-04-12 DIAGNOSIS — I10 Essential (primary) hypertension: Secondary | ICD-10-CM

## 2014-04-24 ENCOUNTER — Other Ambulatory Visit: Payer: Self-pay | Admitting: Family Medicine

## 2014-04-24 DIAGNOSIS — M1990 Unspecified osteoarthritis, unspecified site: Secondary | ICD-10-CM

## 2014-04-24 DIAGNOSIS — B029 Zoster without complications: Secondary | ICD-10-CM

## 2014-04-24 MED ORDER — HYDROCODONE-ACETAMINOPHEN 5-325 MG PO TABS: 1.0000 | ORAL_TABLET | Freq: Four times a day (QID) | ORAL | Status: DC | PRN

## 2014-04-27 ENCOUNTER — Encounter: Payer: Self-pay | Admitting: Family Medicine

## 2014-04-27 ENCOUNTER — Ambulatory Visit (INDEPENDENT_AMBULATORY_CARE_PROVIDER_SITE_OTHER): Payer: Medicare Other | Admitting: Family Medicine

## 2014-04-27 VITALS — BP 128/60 | HR 88 | Temp 99.1°F | Ht 63.0 in | Wt 99.6 lb

## 2014-04-27 DIAGNOSIS — G894 Chronic pain syndrome: Secondary | ICD-10-CM

## 2014-04-27 DIAGNOSIS — M129 Arthropathy, unspecified: Secondary | ICD-10-CM

## 2014-04-27 DIAGNOSIS — M1990 Unspecified osteoarthritis, unspecified site: Secondary | ICD-10-CM

## 2014-04-27 DIAGNOSIS — B029 Zoster without complications: Secondary | ICD-10-CM

## 2014-04-27 MED ORDER — AZITHROMYCIN 250 MG PO TABS: ORAL_TABLET | ORAL | Status: DC

## 2014-04-27 MED ORDER — ONDANSETRON 4 MG PO TBDP: 4.0000 mg | ORAL_TABLET | Freq: Three times a day (TID) | ORAL | Status: DC | PRN

## 2014-04-27 MED ORDER — HYDROCODONE-ACETAMINOPHEN 5-325 MG PO TABS: 1.0000 | ORAL_TABLET | Freq: Four times a day (QID) | ORAL | Status: DC | PRN

## 2014-04-27 MED ORDER — FENTANYL 25 MCG/HR TD PT72: 25.0000 ug | MEDICATED_PATCH | TRANSDERMAL | Status: DC

## 2014-04-27 NOTE — Progress Notes (Signed)
   Subjective:    Patient ID: Kristen Hogan, female    DOB: 10/20/1922, 80 y.o.   MRN: [id number]  HPI  This 80 y.o. female presents for evaluation of severe back pain.  She is accompanied by  Her daughter who states she is waking up and yelling out in pain.  Review of Systems C/o back pain   No chest pain, SOB, HA, dizziness, vision change, N/V, diarrhea, constipation, dysuria, urinary urgency or frequency, myalgias, arthralgias or rash.  Objective:   Physical Exam  Vital signs noted  Well developed well nourished female.  HEENT - Head atraumatic Normocephalic                Eyes - PERRLA, Conjuctiva - clear Sclera- Clear EOMI                Ears - EAC's Wnl TM's Wnl Gross Hearing WNL                 Throat - oropharanx wnl Respiratory - Lungs CTA bilateral Cardiac - RRR S1 and S2 without murmur GI - Abdomen soft Nontender and bowel sounds active x 4 Extremities - No edema. Neuro - Grossly intact.      Assessment & Plan:  Arthritis - Plan: HYDROcodone-acetaminophen (NORCO) 5-325 MG per tablet, POCT CBC, CMP14+EGFR  Shingles - Plan: HYDROcodone-acetaminophen (NORCO) 5-325 MG per tablet, POCT CBC, CMP14+EGFR  Chronic pain syndrome - Plan: POCT CBC, CMP14+EGFR  Lysbeth Penner FNP

## 2014-05-05 ENCOUNTER — Other Ambulatory Visit: Payer: Self-pay | Admitting: Family Medicine

## 2014-05-08 NOTE — Telephone Encounter (Signed)
Please advise on refill. Looks like their is an order for a potassium level to be drawn but has not yet been done

## 2014-05-10 ENCOUNTER — Encounter (INDEPENDENT_AMBULATORY_CARE_PROVIDER_SITE_OTHER): Payer: Medicare Other | Admitting: Ophthalmology

## 2014-05-17 ENCOUNTER — Ambulatory Visit: Payer: Medicare Other | Admitting: Family Medicine

## 2014-05-21 ENCOUNTER — Telehealth: Payer: Self-pay | Admitting: Family Medicine

## 2014-05-22 ENCOUNTER — Telehealth: Payer: Self-pay | Admitting: Family Medicine

## 2014-05-23 ENCOUNTER — Other Ambulatory Visit: Payer: Self-pay | Admitting: Family Medicine

## 2014-05-23 DIAGNOSIS — M1990 Unspecified osteoarthritis, unspecified site: Secondary | ICD-10-CM

## 2014-05-23 DIAGNOSIS — B029 Zoster without complications: Secondary | ICD-10-CM

## 2014-05-23 MED ORDER — HYDROCODONE-ACETAMINOPHEN 5-325 MG PO TABS: 1.0000 | ORAL_TABLET | Freq: Four times a day (QID) | ORAL | Status: DC | PRN

## 2014-05-23 NOTE — Telephone Encounter (Signed)
Script for [employer] ready.  Patient aware.

## 2014-06-03 ENCOUNTER — Other Ambulatory Visit: Payer: Self-pay | Admitting: Family Medicine

## 2014-06-07 ENCOUNTER — Ambulatory Visit (INDEPENDENT_AMBULATORY_CARE_PROVIDER_SITE_OTHER): Payer: Medicare Other | Admitting: Family Medicine

## 2014-06-07 ENCOUNTER — Encounter: Payer: Self-pay | Admitting: Family Medicine

## 2014-06-07 VITALS — BP 131/65 | HR 63 | Temp 97.3°F | Ht 63.0 in | Wt 98.4 lb

## 2014-06-07 DIAGNOSIS — M1990 Unspecified osteoarthritis, unspecified site: Secondary | ICD-10-CM

## 2014-06-07 DIAGNOSIS — B0229 Other postherpetic nervous system involvement: Secondary | ICD-10-CM

## 2014-06-07 DIAGNOSIS — B029 Zoster without complications: Secondary | ICD-10-CM

## 2014-06-07 DIAGNOSIS — M129 Arthropathy, unspecified: Secondary | ICD-10-CM

## 2014-06-07 LAB — POCT CBC
Granulocyte percent: 79.6 %G (ref 37–80)
HCT, POC: 34.2 % — AB (ref 37.7–47.9)
Hemoglobin: 10.7 g/dL — AB (ref 12.2–16.2)
Lymph, poc: 1.7 (ref 0.6–3.4)
MCH, POC: 28.2 pg (ref 27–31.2)
MCHC: 31.2 g/dL — AB (ref 31.8–35.4)
MCV: 90.4 fL (ref 80–97)
MPV: 7.9 fL (ref 0–99.8)
POC Granulocyte: 9.3 — AB (ref 2–6.9)
POC LYMPH PERCENT: 14.5 %L (ref 10–50)
Platelet Count, POC: 356 10*3/uL (ref 142–424)
RBC: 3.8 M/uL — AB (ref 4.04–5.48)
RDW, POC: 15.7 %
WBC: 11.7 10*3/uL — AB (ref 4.6–10.2)

## 2014-06-07 MED ORDER — HYDROCODONE-ACETAMINOPHEN 5-325 MG PO TABS: 1.0000 | ORAL_TABLET | Freq: Four times a day (QID) | ORAL | Status: DC | PRN

## 2014-06-07 NOTE — Progress Notes (Signed)
   Subjective:    Patient ID: Kristen Hogan, female    DOB: 26-Aug-1922, 81 y.o.   MRN: [id number]  HPI  C/o severe pain left back and side due to post herpetic neuropathy from shingles.  She has hx of COPD and is doing ok and no c/o exacerbation.  She has been upset because she has hospice coming to her house and she does not know why.  Her daughter accompanies her and states she did not here anything about this and has questions as to why it was ordered to begin with.  She  Is not currently suffering terminal illness requiring hospice care.  Review of Systems    No chest pain, SOB, HA, dizziness, vision change, N/V, diarrhea, constipation, dysuria, urinary urgency or frequency, myalgias, arthralgias or rash.  Objective:   Physical Exam  Vital signs noted  Chronically ill elderly female  HEENT - Head atraumatic Normocephalic                Eyes - PERRLA, Conjuctiva - clear Sclera- Clear EOMI                Ears - EAC's Wnl TM's Wnl Gross Hearing WNL                Throat - oropharanx wnl Respiratory - Lungs CTA bilateral Cardiac - RRR S1 and S2 without murmur GI - Abdomen soft Nontender and bowel sounds active x 4 MS - TTP left posterior thoracic region and left lateral chest wall area      Assessment & Plan:  Arthritis - Plan: HYDROcodone-acetaminophen (NORCO) 5-325 MG per tablet, POCT CBC, CMP14+EGFR  Shingles - Plan: HYDROcodone-acetaminophen (NORCO) 5-325 MG per tablet, POCT CBC, CMP14+EGFR  Post herpetic Neuralgia - Continue hydrocodone for chronic pain.  As to hospice I do not see a need for this and the daughter still wishes to talk with them when they come out.  Please call if there are any questions.  Lysbeth Penner FNP

## 2014-06-08 LAB — CMP14+EGFR
ALT: 105 IU/L — ABNORMAL HIGH (ref 0–32)
AST: 109 IU/L — ABNORMAL HIGH (ref 0–40)
Albumin/Globulin Ratio: 1.5 (ref 1.1–2.5)
Albumin: 3.5 g/dL (ref 3.2–4.6)
Alkaline Phosphatase: 339 IU/L — ABNORMAL HIGH (ref 39–117)
BUN/Creatinine Ratio: 26 (ref 11–26)
BUN: 28 mg/dL (ref 10–36)
CO2: 25 mmol/L (ref 18–29)
Calcium: 10.6 mg/dL — ABNORMAL HIGH (ref 8.7–10.3)
Chloride: 100 mmol/L (ref 97–108)
Creatinine, Ser: 1.06 mg/dL — ABNORMAL HIGH (ref 0.57–1.00)
GFR calc Af Amer: 53 mL/min/{1.73_m2} — ABNORMAL LOW (ref >59)
GFR calc non Af Amer: 46 mL/min/{1.73_m2} — ABNORMAL LOW (ref >59)
Globulin, Total: 2.4 g/dL (ref 1.5–4.5)
Glucose: 93 mg/dL (ref 65–99)
Potassium: 5.3 mmol/L — ABNORMAL HIGH (ref 3.5–5.2)
Sodium: 141 mmol/L (ref 134–144)
Total Bilirubin: 0.3 mg/dL (ref 0.0–1.2)
Total Protein: 5.9 g/dL — ABNORMAL LOW (ref 6.0–8.5)

## 2014-06-12 ENCOUNTER — Telehealth: Payer: Self-pay | Admitting: *Deleted

## 2014-06-12 NOTE — Telephone Encounter (Signed)
[facility] was in patients house for a referral, not Hospice! These meds are on her med list but not in the home. Amlodipine, protonix, pepcid, hydroxyzine, tramadol, lidoderm patch. Please let me know whether to order these or D/C them. Route back to [facility]

## 2014-06-16 ENCOUNTER — Other Ambulatory Visit: Payer: Self-pay | Admitting: Family Medicine

## 2014-06-16 MED ORDER — HYDROCODONE-IBUPROFEN 7.5-200 MG PO TABS: 1.0000 | ORAL_TABLET | Freq: Three times a day (TID) | ORAL | Status: DC | PRN

## 2014-06-18 ENCOUNTER — Encounter: Payer: Self-pay | Admitting: Family Medicine

## 2014-06-18 ENCOUNTER — Ambulatory Visit (INDEPENDENT_AMBULATORY_CARE_PROVIDER_SITE_OTHER): Payer: Medicare Other | Admitting: Family Medicine

## 2014-06-18 ENCOUNTER — Other Ambulatory Visit (INDEPENDENT_AMBULATORY_CARE_PROVIDER_SITE_OTHER): Payer: Medicare Other

## 2014-06-18 VITALS — BP 120/61 | HR 63 | Temp 97.7°F | Ht 63.0 in | Wt 97.0 lb

## 2014-06-18 DIAGNOSIS — R0789 Other chest pain: Secondary | ICD-10-CM

## 2014-06-18 DIAGNOSIS — R7989 Other specified abnormal findings of blood chemistry: Secondary | ICD-10-CM

## 2014-06-18 DIAGNOSIS — R071 Chest pain on breathing: Secondary | ICD-10-CM

## 2014-06-18 NOTE — Progress Notes (Signed)
   Subjective:    Patient ID: Kristen Hogan, female    DOB: July 17, 1922, 80 y.o.   MRN: [id number]  HPI  This 80 y.o. female presents for evaluation of left breast pain.  She has hx of shingles.  Review of Systems No chest pain, SOB, HA, dizziness, vision change, N/V, diarrhea, constipation, dysuria, urinary urgency or frequency, myalgias, arthralgias or rash.     Objective:   Physical Exam  Vital signs noted  Well developed well nourished female.  HEENT - Head atraumatic Normocephalic Respiratory - Lungs CTA bilateral Cardiac - RRR S1 and S2 without murmur Skin - No rashes MS - TTP left intercostal under breast     Assessment & Plan:  Chest wall pain - Lidoderm patches 12 hours on and 12 hours off #2 samples given  Lysbeth Penner FNP

## 2014-06-19 ENCOUNTER — Other Ambulatory Visit: Payer: Self-pay | Admitting: Family Medicine

## 2014-06-19 LAB — CMP14+EGFR
ALT: 50 IU/L — ABNORMAL HIGH (ref 0–32)
AST: 92 IU/L — ABNORMAL HIGH (ref 0–40)
Albumin/Globulin Ratio: 1.5 (ref 1.1–2.5)
Albumin: 3.5 g/dL (ref 3.2–4.6)
Alkaline Phosphatase: 395 IU/L — ABNORMAL HIGH (ref 39–117)
BUN/Creatinine Ratio: 31 — ABNORMAL HIGH (ref 11–26)
BUN: 30 mg/dL (ref 10–36)
CO2: 24 mmol/L (ref 18–29)
Calcium: 10 mg/dL (ref 8.7–10.3)
Chloride: 100 mmol/L (ref 97–108)
Creatinine, Ser: 0.97 mg/dL (ref 0.57–1.00)
GFR calc Af Amer: 59 mL/min/{1.73_m2} — ABNORMAL LOW (ref >59)
GFR calc non Af Amer: 52 mL/min/{1.73_m2} — ABNORMAL LOW (ref >59)
Globulin, Total: 2.3 g/dL (ref 1.5–4.5)
Glucose: 90 mg/dL (ref 65–99)
Potassium: 4.3 mmol/L (ref 3.5–5.2)
Sodium: 139 mmol/L (ref 134–144)
Total Bilirubin: 0.3 mg/dL (ref 0.0–1.2)
Total Protein: 5.8 g/dL — ABNORMAL LOW (ref 6.0–8.5)

## 2014-06-20 ENCOUNTER — Telehealth: Payer: Self-pay | Admitting: Family Medicine

## 2014-06-20 NOTE — Telephone Encounter (Signed)
Keeps hanging up when called

## 2014-06-20 NOTE — Telephone Encounter (Signed)
Message copied by Waverly Ferrari on Wed Jun 20, 2014  9:38 AM ------      Message from: Lysbeth Penner      Created: Tue Jun 19, 2014 10:21 AM       Transaminases elevated but has come down and recommend repeat LFT in 2 weeks.  CBC shows anemia and repeat cbc with anemia panel ------

## 2014-06-23 ENCOUNTER — Telehealth: Payer: Self-pay | Admitting: Family Medicine

## 2014-06-23 ENCOUNTER — Other Ambulatory Visit: Payer: Self-pay | Admitting: Nurse Practitioner

## 2014-06-23 DIAGNOSIS — B029 Zoster without complications: Secondary | ICD-10-CM

## 2014-06-23 DIAGNOSIS — M1990 Unspecified osteoarthritis, unspecified site: Secondary | ICD-10-CM

## 2014-06-23 MED ORDER — LIDOCAINE 5 % EX PTCH: 3.0000 | MEDICATED_PATCH | CUTANEOUS | Status: AC

## 2014-06-25 ENCOUNTER — Encounter: Payer: Medicare Other | Admitting: Family Medicine

## 2014-06-25 MED ORDER — ATORVASTATIN CALCIUM 20 MG PO TABS: 20.0000 mg | ORAL_TABLET | Freq: Every day | ORAL | Status: AC

## 2014-06-25 NOTE — Telephone Encounter (Signed)
appt scheduled

## 2014-06-26 ENCOUNTER — Encounter: Payer: Self-pay | Admitting: Family Medicine

## 2014-06-26 NOTE — Progress Notes (Signed)
   Subjective:    Patient ID: Kristen Hogan, female    DOB: Jul 08, 1922, 80 y.o.   MRN: [id number]  HPI Patient not seen   Review of Systems     Objective:   Physical Exam        Assessment & Plan:

## 2014-06-27 ENCOUNTER — Other Ambulatory Visit: Payer: Self-pay | Admitting: *Deleted

## 2014-06-27 ENCOUNTER — Other Ambulatory Visit: Payer: Self-pay | Admitting: Family Medicine

## 2014-06-27 ENCOUNTER — Telehealth: Payer: Self-pay | Admitting: *Deleted

## 2014-06-27 DIAGNOSIS — B029 Zoster without complications: Secondary | ICD-10-CM

## 2014-06-27 DIAGNOSIS — M1990 Unspecified osteoarthritis, unspecified site: Secondary | ICD-10-CM

## 2014-06-27 MED ORDER — HYDROCHLOROTHIAZIDE 25 MG PO TABS: ORAL_TABLET | ORAL | Status: AC

## 2014-06-27 MED ORDER — AMITRIPTYLINE HCL 10 MG PO TABS: 10.0000 mg | ORAL_TABLET | Freq: Every day | ORAL | Status: AC

## 2014-06-27 NOTE — Telephone Encounter (Signed)
Can you check on this? thanks 

## 2014-06-27 NOTE — Telephone Encounter (Signed)
Patients med list has been updated according to recent rx refills and info from daughter about current medications. Several meds were removed because patient hasn't taken in several months. Please review current med list and advise if you would like to make any changes

## 2014-07-09 ENCOUNTER — Other Ambulatory Visit: Payer: Self-pay | Admitting: Family Medicine

## 2014-07-09 ENCOUNTER — Ambulatory Visit: Payer: Medicare Other | Admitting: Family Medicine

## 2014-07-09 ENCOUNTER — Telehealth: Payer: Self-pay | Admitting: *Deleted

## 2014-07-09 MED ORDER — HYDROCODONE-IBUPROFEN 7.5-200 MG PO TABS: 1.0000 | ORAL_TABLET | Freq: Three times a day (TID) | ORAL | Status: AC | PRN

## 2014-07-09 NOTE — Telephone Encounter (Signed)
Pt needs hydrocodone called in, she is in a lot of pain per Almyra Free, call daughter to pick up script

## 2014-07-25 ENCOUNTER — Encounter: Payer: Self-pay | Admitting: Family Medicine

## 2014-07-25 ENCOUNTER — Ambulatory Visit (INDEPENDENT_AMBULATORY_CARE_PROVIDER_SITE_OTHER): Payer: Medicare Other | Admitting: Family Medicine

## 2014-07-25 VITALS — BP 108/61 | HR 60 | Temp 98.3°F | Ht 63.0 in | Wt 97.0 lb

## 2014-07-25 DIAGNOSIS — R609 Edema, unspecified: Secondary | ICD-10-CM

## 2014-07-25 DIAGNOSIS — R7989 Other specified abnormal findings of blood chemistry: Secondary | ICD-10-CM

## 2014-07-25 DIAGNOSIS — D508 Other iron deficiency anemias: Secondary | ICD-10-CM

## 2014-07-25 DIAGNOSIS — R945 Abnormal results of liver function studies: Secondary | ICD-10-CM

## 2014-07-25 MED ORDER — FUROSEMIDE 20 MG PO TABS: ORAL_TABLET | ORAL | Status: AC

## 2014-07-25 NOTE — Addendum Note (Signed)
Addended by: Pollyann Kennedy F on: 07/25/2014 12:54 PM   Modules accepted: Orders

## 2014-07-25 NOTE — Progress Notes (Signed)
   Subjective:    Patient ID: Kristen Hogan, female    DOB: 1922/01/24, 80 y.o.   MRN: [id number]  HPI This 80 y.o. female presents for evaluation of lower extremity edema and routine follow up.  She has elevated transaminases and anemia and is here for follow up labs.   Review of Systems No chest pain, SOB, HA, dizziness, vision change, N/V, diarrhea, constipation, dysuria, urinary urgency or frequency, myalgias, arthralgias or rash.     Objective:   Physical Exam   Vital signs noted  Chronically ill appearing female.  HEENT - Head atraumatic Normocephalic Respiratory - Lungs CTA bilateral Cardiac - RRR S1 and S2 without murmur GI - Abdomen soft Nontender and bowel sounds active x 4 Extremities - No edema. Neuro - Grossly intact.     Assessment & Plan:  Other iron deficiency anemias - Plan: POCT CBC, Anemia Profile B  Elevated LFTs - Plan: Hepatic function panel  Edema - Plan: furosemide (LASIX) 20 MG tablet  Lysbeth Penner FNP

## 2014-07-26 LAB — ANEMIA PROFILE B
Basophils Absolute: 0 10*3/uL (ref 0.0–0.2)
Basos: 0 %
Eos: 1 %
Eosinophils Absolute: 0.1 10*3/uL (ref 0.0–0.4)
Ferritin: 537 ng/mL — ABNORMAL HIGH (ref 15–150)
Folate: >19.9 ng/mL (ref >3.0)
HCT: 33.5 % — ABNORMAL LOW (ref 34.0–46.6)
Hemoglobin: 10.9 g/dL — ABNORMAL LOW (ref 11.1–15.9)
Immature Grans (Abs): 0 10*3/uL (ref 0.0–0.1)
Immature Granulocytes: 0 %
Iron Saturation: 13 % — ABNORMAL LOW (ref 15–55)
Iron: 24 ug/dL — ABNORMAL LOW (ref 35–155)
Lymphocytes Absolute: 0.8 10*3/uL (ref 0.7–3.1)
Lymphs: 5 %
MCH: 28.6 pg (ref 26.6–33.0)
MCHC: 32.5 g/dL (ref 31.5–35.7)
MCV: 88 fL (ref 79–97)
Monocytes Absolute: 1.3 10*3/uL — ABNORMAL HIGH (ref 0.1–0.9)
Monocytes: 8 %
Neutrophils Absolute: 13.2 10*3/uL — ABNORMAL HIGH (ref 1.4–7.0)
Neutrophils Relative %: 86 %
Platelets: 343 10*3/uL (ref 150–379)
RBC: 3.81 x10E6/uL (ref 3.77–5.28)
RDW: 15.4 % (ref 12.3–15.4)
Retic Ct Pct: 1.5 % (ref 0.6–2.6)
TIBC: 186 ug/dL — ABNORMAL LOW (ref 250–450)
UIBC: 162 ug/dL (ref 150–375)
Vitamin B-12: >1999 pg/mL — ABNORMAL HIGH (ref 211–946)
WBC: 15.4 10*3/uL — ABNORMAL HIGH (ref 3.4–10.8)

## 2014-07-26 LAB — HEPATIC FUNCTION PANEL
ALT: 35 IU/L — ABNORMAL HIGH (ref 0–32)
AST: 97 IU/L — ABNORMAL HIGH (ref 0–40)
Albumin: 2.9 g/dL — ABNORMAL LOW (ref 3.2–4.6)
Alkaline Phosphatase: 298 IU/L — ABNORMAL HIGH (ref 39–117)
Bilirubin, Direct: 0.25 mg/dL (ref 0.00–0.40)
Total Bilirubin: 0.5 mg/dL (ref 0.0–1.2)
Total Protein: 5.3 g/dL — ABNORMAL LOW (ref 6.0–8.5)

## 2014-07-31 ENCOUNTER — Telehealth: Payer: Self-pay | Admitting: *Deleted

## 2014-07-31 ENCOUNTER — Telehealth: Payer: Self-pay | Admitting: Family Medicine

## 2014-07-31 NOTE — Telephone Encounter (Signed)
See if home health can insert a feeding tube and monitor this patient at the home

## 2014-07-31 NOTE — Telephone Encounter (Signed)
This patient is okay for in home hospice

## 2014-07-31 NOTE — Telephone Encounter (Signed)
Kristen Hogan has asked if we can have an order for hospice in home for this pt ASAP. She is Kristen Hogan's patient. You will have to sign orders. FYI

## 2014-08-01 ENCOUNTER — Other Ambulatory Visit: Payer: Self-pay | Admitting: Family Medicine

## 2014-08-01 DIAGNOSIS — R945 Abnormal results of liver function studies: Principal | ICD-10-CM

## 2014-08-01 DIAGNOSIS — R7989 Other specified abnormal findings of blood chemistry: Secondary | ICD-10-CM

## 2014-08-01 NOTE — Telephone Encounter (Signed)
Kristen Hogan called this am, pt is going to be admitted to hospice today, changed there mind, pt does not want to go the hosp again.

## 2014-08-02 ENCOUNTER — Other Ambulatory Visit: Payer: Self-pay | Admitting: *Deleted

## 2014-08-02 ENCOUNTER — Telehealth: Payer: Self-pay | Admitting: *Deleted

## 2014-08-02 DIAGNOSIS — R52 Pain, unspecified: Secondary | ICD-10-CM

## 2014-08-02 DIAGNOSIS — R0602 Shortness of breath: Secondary | ICD-10-CM

## 2014-08-02 DIAGNOSIS — Z515 Encounter for palliative care: Secondary | ICD-10-CM

## 2014-08-02 DIAGNOSIS — R5381 Other malaise: Secondary | ICD-10-CM

## 2014-08-02 DIAGNOSIS — J449 Chronic obstructive pulmonary disease, unspecified: Secondary | ICD-10-CM

## 2014-08-02 DIAGNOSIS — R5383 Other fatigue: Secondary | ICD-10-CM

## 2014-08-02 MED ORDER — MORPHINE SULFATE (CONCENTRATE) 20 MG/ML PO SOLN: 10.0000 mg | ORAL | Status: AC | PRN

## 2014-08-02 NOTE — Progress Notes (Signed)
DNR scanned to chart  

## 2014-08-02 NOTE — Telephone Encounter (Signed)
Hospice needs Roxanol Rx for pt. Order sent to Dr. Laurance Flatten to sign

## 2014-08-03 ENCOUNTER — Ambulatory Visit: Payer: Medicare Other | Admitting: Family Medicine

## 2014-08-09 ENCOUNTER — Other Ambulatory Visit (HOSPITAL_COMMUNITY): Payer: Medicare Other

## 2014-08-13 ENCOUNTER — Other Ambulatory Visit: Payer: Self-pay | Admitting: Family Medicine

## 2014-08-16 DEATH — deceased

## 2014-08-27 ENCOUNTER — Ambulatory Visit: Payer: Medicare Other | Admitting: Family Medicine

## 2014-08-30 IMAGING — CR DG LUMBAR SPINE 2-3V
2 series · 2 of 2 positions shown · non-contrast
Comparison: No priors.

CLINICAL DATA: Back pain.

LUMBAR SPINE - 2-3 VIEW

[view not recorded (1 of 2)]
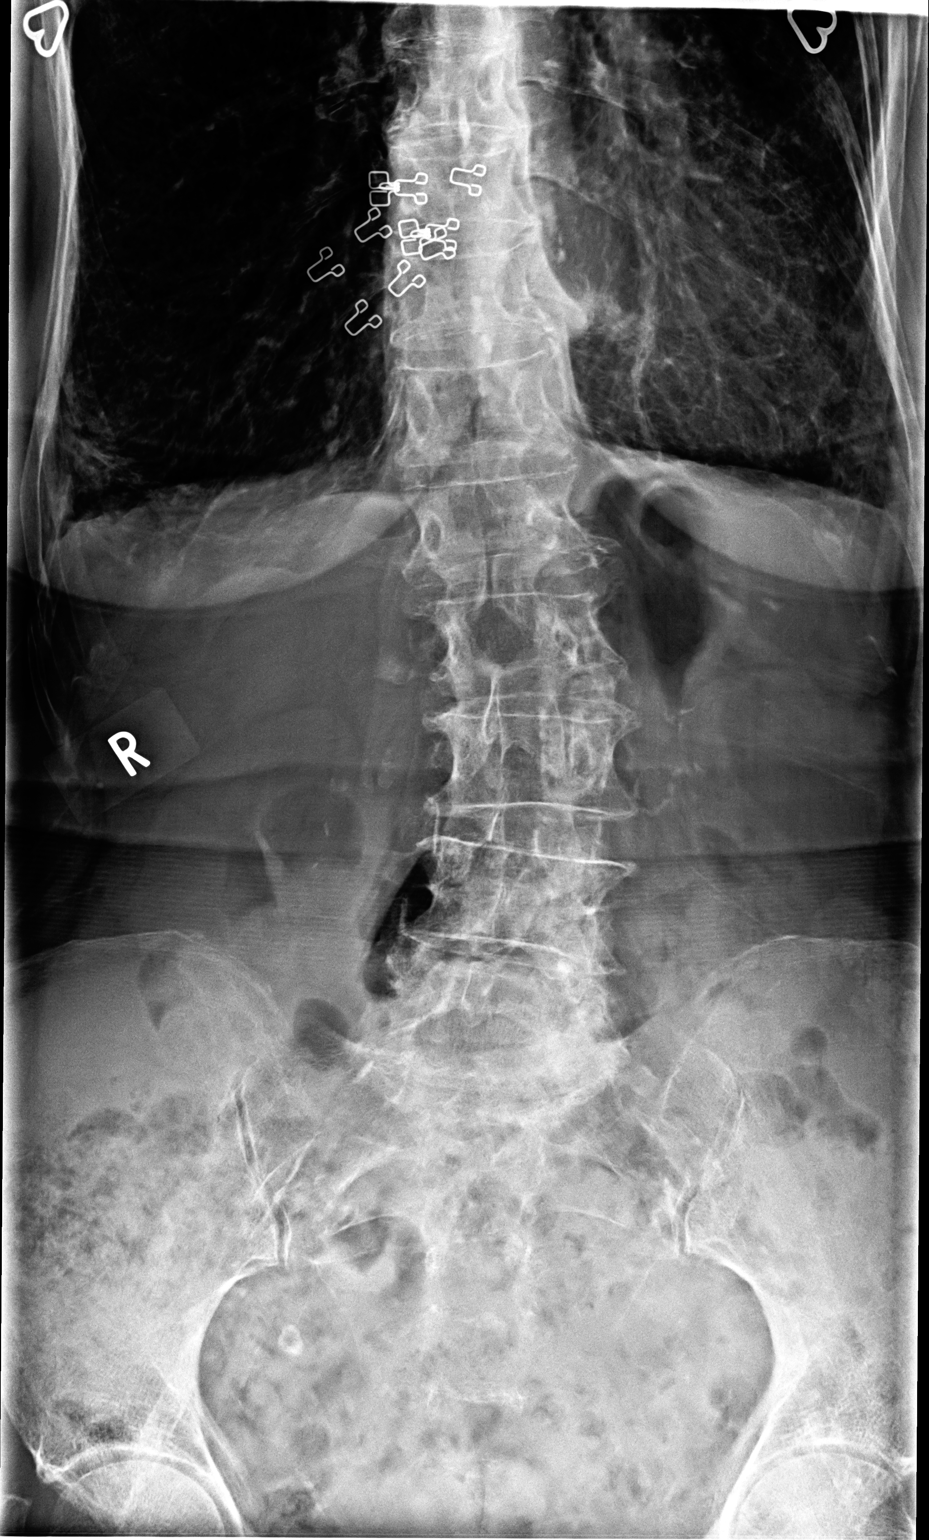

[view not recorded (2 of 2)]
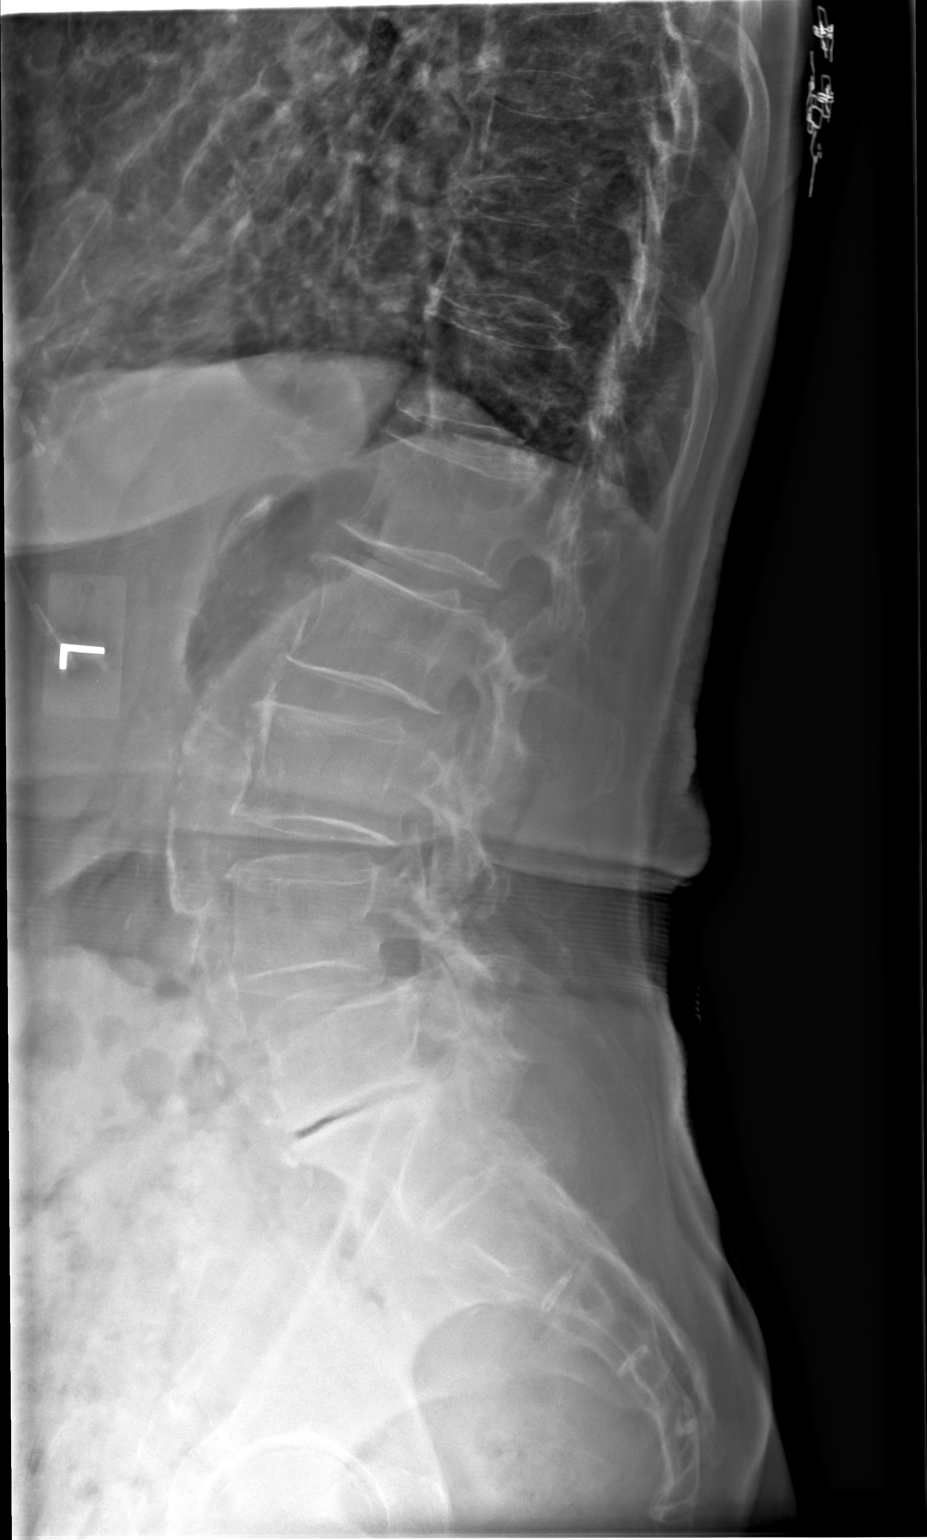

[2 of 2 positions shown; findings below may reference images not displayed]

FINDINGS: Two views of the lumbar spine demonstrate no definite
acute displaced fractures or compression type fractures.  There is
mild levoscoliosis centered at the level of L3.  Multilevel
degenerative disc disease and facet arthropathy is noted, most
pronounced at L4-L5 and L5-S1.  4 mm of anterolisthesis of L4 upon
L5 is noted.  Alignment is otherwise anatomic. Extensive
atherosclerotic calcifications are noted.
IMPRESSION: 1.  No acute radiographic abnormality of the lumbar spine.
2.  Extensive multilevel degenerative disc disease and lumbar
spondylosis, as detailed above, including mild levoscoliosis of the
lumbar spine.
